# Patient Record
Sex: Male | Born: 1960 | Race: White | Hispanic: No | Marital: Single | Smoking: Never smoker
Health system: Southern US, Community
[De-identification: ages and names within clinical notes are randomized; demographics above are authoritative.]

## PROBLEM LIST (undated history)

## (undated) DIAGNOSIS — F419 Anxiety disorder, unspecified: Secondary | ICD-10-CM

## (undated) DIAGNOSIS — E119 Type 2 diabetes mellitus without complications: Secondary | ICD-10-CM

---

## 1999-06-30 ENCOUNTER — Inpatient Hospital Stay (HOSPITAL_COMMUNITY): Admission: EM | Admit: 1999-06-30 | Discharge: 1999-07-03 | Payer: Self-pay | Admitting: Psychiatry

## 1999-07-21 ENCOUNTER — Emergency Department (HOSPITAL_COMMUNITY): Admission: EM | Admit: 1999-07-21 | Discharge: 1999-07-21 | Payer: Self-pay | Admitting: Emergency Medicine

## 1999-07-21 ENCOUNTER — Encounter: Payer: Self-pay | Admitting: Emergency Medicine

## 1999-07-28 ENCOUNTER — Inpatient Hospital Stay (HOSPITAL_COMMUNITY): Admission: EM | Admit: 1999-07-28 | Discharge: 1999-08-01 | Payer: Self-pay | Admitting: Psychiatry

## 1999-08-15 ENCOUNTER — Inpatient Hospital Stay (HOSPITAL_COMMUNITY): Admission: AD | Admit: 1999-08-15 | Discharge: 1999-08-21 | Payer: Self-pay | Admitting: Psychiatry

## 2000-05-04 ENCOUNTER — Inpatient Hospital Stay (HOSPITAL_COMMUNITY): Admission: EM | Admit: 2000-05-04 | Discharge: 2000-05-07 | Payer: Self-pay | Admitting: Emergency Medicine

## 2000-05-04 ENCOUNTER — Encounter: Payer: Self-pay | Admitting: Emergency Medicine

## 2001-07-14 ENCOUNTER — Inpatient Hospital Stay (HOSPITAL_COMMUNITY): Admission: EM | Admit: 2001-07-14 | Discharge: 2001-07-18 | Payer: Self-pay | Admitting: Emergency Medicine

## 2002-12-15 ENCOUNTER — Encounter: Payer: Self-pay | Admitting: *Deleted

## 2002-12-15 ENCOUNTER — Emergency Department (HOSPITAL_COMMUNITY): Admission: EM | Admit: 2002-12-15 | Discharge: 2002-12-15 | Payer: Self-pay | Admitting: Emergency Medicine

## 2003-04-09 ENCOUNTER — Emergency Department (HOSPITAL_COMMUNITY): Admission: EM | Admit: 2003-04-09 | Discharge: 2003-04-10 | Payer: Self-pay | Admitting: Emergency Medicine

## 2003-04-10 ENCOUNTER — Encounter: Payer: Self-pay | Admitting: Emergency Medicine

## 2003-04-12 ENCOUNTER — Emergency Department (HOSPITAL_COMMUNITY): Admission: EM | Admit: 2003-04-12 | Discharge: 2003-04-12 | Payer: Self-pay | Admitting: Emergency Medicine

## 2003-04-12 ENCOUNTER — Encounter: Payer: Self-pay | Admitting: Emergency Medicine

## 2003-04-25 ENCOUNTER — Emergency Department (HOSPITAL_COMMUNITY): Admission: EM | Admit: 2003-04-25 | Discharge: 2003-04-25 | Payer: Self-pay | Admitting: Emergency Medicine

## 2003-04-25 ENCOUNTER — Encounter: Payer: Self-pay | Admitting: Emergency Medicine

## 2003-05-08 ENCOUNTER — Ambulatory Visit (HOSPITAL_COMMUNITY): Admission: RE | Admit: 2003-05-08 | Discharge: 2003-05-08 | Payer: Self-pay | Admitting: Internal Medicine

## 2003-06-29 ENCOUNTER — Ambulatory Visit (HOSPITAL_COMMUNITY): Admission: RE | Admit: 2003-06-29 | Discharge: 2003-06-29 | Payer: Self-pay | Admitting: Urology

## 2003-06-29 ENCOUNTER — Encounter: Payer: Self-pay | Admitting: Urology

## 2003-07-24 ENCOUNTER — Emergency Department (HOSPITAL_COMMUNITY): Admission: EM | Admit: 2003-07-24 | Discharge: 2003-07-25 | Payer: Self-pay | Admitting: *Deleted

## 2003-07-30 ENCOUNTER — Encounter: Payer: Self-pay | Admitting: *Deleted

## 2003-07-30 ENCOUNTER — Emergency Department (HOSPITAL_COMMUNITY): Admission: EM | Admit: 2003-07-30 | Discharge: 2003-07-31 | Payer: Self-pay | Admitting: *Deleted

## 2003-08-17 ENCOUNTER — Encounter: Payer: Self-pay | Admitting: Emergency Medicine

## 2003-08-17 ENCOUNTER — Emergency Department (HOSPITAL_COMMUNITY): Admission: EM | Admit: 2003-08-17 | Discharge: 2003-08-17 | Payer: Self-pay | Admitting: Emergency Medicine

## 2003-08-19 ENCOUNTER — Emergency Department (HOSPITAL_COMMUNITY): Admission: EM | Admit: 2003-08-19 | Discharge: 2003-08-19 | Payer: Self-pay | Admitting: Emergency Medicine

## 2003-08-19 ENCOUNTER — Encounter: Payer: Self-pay | Admitting: Emergency Medicine

## 2003-08-20 ENCOUNTER — Encounter: Payer: Self-pay | Admitting: Internal Medicine

## 2003-08-20 ENCOUNTER — Ambulatory Visit (HOSPITAL_COMMUNITY): Admission: RE | Admit: 2003-08-20 | Discharge: 2003-08-20 | Payer: Self-pay | Admitting: Internal Medicine

## 2003-09-03 ENCOUNTER — Ambulatory Visit (HOSPITAL_COMMUNITY): Admission: RE | Admit: 2003-09-03 | Discharge: 2003-09-03 | Payer: Self-pay | Admitting: Internal Medicine

## 2003-09-05 ENCOUNTER — Encounter: Payer: Self-pay | Admitting: Internal Medicine

## 2003-09-05 ENCOUNTER — Ambulatory Visit (HOSPITAL_COMMUNITY): Admission: RE | Admit: 2003-09-05 | Discharge: 2003-09-05 | Payer: Self-pay | Admitting: Internal Medicine

## 2003-09-07 ENCOUNTER — Emergency Department (HOSPITAL_COMMUNITY): Admission: EM | Admit: 2003-09-07 | Discharge: 2003-09-08 | Payer: Self-pay | Admitting: *Deleted

## 2003-10-11 ENCOUNTER — Ambulatory Visit (HOSPITAL_COMMUNITY): Admission: RE | Admit: 2003-10-11 | Discharge: 2003-10-11 | Payer: Self-pay | Admitting: Family Medicine

## 2003-10-11 ENCOUNTER — Encounter: Payer: Self-pay | Admitting: Family Medicine

## 2003-10-21 ENCOUNTER — Emergency Department (HOSPITAL_COMMUNITY): Admission: EM | Admit: 2003-10-21 | Discharge: 2003-10-22 | Payer: Self-pay | Admitting: *Deleted

## 2003-12-03 ENCOUNTER — Emergency Department (HOSPITAL_COMMUNITY): Admission: EM | Admit: 2003-12-03 | Discharge: 2003-12-04 | Payer: Self-pay | Admitting: Internal Medicine

## 2003-12-04 ENCOUNTER — Ambulatory Visit (HOSPITAL_COMMUNITY): Admission: RE | Admit: 2003-12-04 | Discharge: 2003-12-04 | Payer: Self-pay | Admitting: Family Medicine

## 2003-12-04 ENCOUNTER — Emergency Department (HOSPITAL_COMMUNITY): Admission: EM | Admit: 2003-12-04 | Discharge: 2003-12-05 | Payer: Self-pay | Admitting: Internal Medicine

## 2003-12-05 ENCOUNTER — Inpatient Hospital Stay (HOSPITAL_COMMUNITY): Admission: EM | Admit: 2003-12-05 | Discharge: 2003-12-11 | Payer: Self-pay | Admitting: Psychiatry

## 2003-12-08 ENCOUNTER — Encounter: Payer: Self-pay | Admitting: Emergency Medicine

## 2003-12-13 ENCOUNTER — Inpatient Hospital Stay (HOSPITAL_COMMUNITY): Admission: AD | Admit: 2003-12-13 | Discharge: 2003-12-15 | Payer: Self-pay | Admitting: Family Medicine

## 2003-12-15 ENCOUNTER — Emergency Department (HOSPITAL_COMMUNITY): Admission: EM | Admit: 2003-12-15 | Discharge: 2003-12-16 | Payer: Self-pay | Admitting: Emergency Medicine

## 2003-12-16 ENCOUNTER — Emergency Department (HOSPITAL_COMMUNITY): Admission: EM | Admit: 2003-12-16 | Discharge: 2003-12-17 | Payer: Self-pay | Admitting: Emergency Medicine

## 2003-12-30 ENCOUNTER — Emergency Department (HOSPITAL_COMMUNITY): Admission: EM | Admit: 2003-12-30 | Discharge: 2003-12-31 | Payer: Self-pay | Admitting: Emergency Medicine

## 2004-03-20 ENCOUNTER — Inpatient Hospital Stay (HOSPITAL_COMMUNITY): Admission: AD | Admit: 2004-03-20 | Discharge: 2004-03-26 | Payer: Self-pay | Admitting: Family Medicine

## 2004-04-07 ENCOUNTER — Ambulatory Visit (HOSPITAL_COMMUNITY): Admission: RE | Admit: 2004-04-07 | Discharge: 2004-04-07 | Payer: Self-pay | Admitting: Family Medicine

## 2004-04-17 ENCOUNTER — Emergency Department (HOSPITAL_COMMUNITY): Admission: EM | Admit: 2004-04-17 | Discharge: 2004-04-17 | Payer: Self-pay | Admitting: Emergency Medicine

## 2004-04-23 ENCOUNTER — Emergency Department (HOSPITAL_COMMUNITY): Admission: EM | Admit: 2004-04-23 | Discharge: 2004-04-23 | Payer: Self-pay | Admitting: Emergency Medicine

## 2004-05-12 ENCOUNTER — Emergency Department (HOSPITAL_COMMUNITY): Admission: EM | Admit: 2004-05-12 | Discharge: 2004-05-12 | Payer: Self-pay | Admitting: Emergency Medicine

## 2004-06-02 ENCOUNTER — Emergency Department (HOSPITAL_COMMUNITY): Admission: EM | Admit: 2004-06-02 | Discharge: 2004-06-02 | Payer: Self-pay | Admitting: Emergency Medicine

## 2004-07-01 ENCOUNTER — Emergency Department (HOSPITAL_COMMUNITY): Admission: EM | Admit: 2004-07-01 | Discharge: 2004-07-01 | Payer: Self-pay | Admitting: Emergency Medicine

## 2004-07-07 ENCOUNTER — Emergency Department (HOSPITAL_COMMUNITY): Admission: EM | Admit: 2004-07-07 | Discharge: 2004-07-07 | Payer: Self-pay | Admitting: Emergency Medicine

## 2004-07-08 ENCOUNTER — Emergency Department (HOSPITAL_COMMUNITY): Admission: EM | Admit: 2004-07-08 | Discharge: 2004-07-08 | Payer: Self-pay | Admitting: Emergency Medicine

## 2004-07-22 ENCOUNTER — Emergency Department (HOSPITAL_COMMUNITY): Admission: EM | Admit: 2004-07-22 | Discharge: 2004-07-23 | Payer: Self-pay | Admitting: Emergency Medicine

## 2004-09-19 ENCOUNTER — Emergency Department (HOSPITAL_COMMUNITY): Admission: EM | Admit: 2004-09-19 | Discharge: 2004-09-20 | Payer: Self-pay | Admitting: Emergency Medicine

## 2004-09-20 ENCOUNTER — Emergency Department (HOSPITAL_COMMUNITY): Admission: EM | Admit: 2004-09-20 | Discharge: 2004-09-20 | Payer: Self-pay | Admitting: Emergency Medicine

## 2004-09-21 ENCOUNTER — Inpatient Hospital Stay (HOSPITAL_COMMUNITY): Admission: EM | Admit: 2004-09-21 | Discharge: 2004-09-23 | Payer: Self-pay | Admitting: Emergency Medicine

## 2004-09-25 ENCOUNTER — Emergency Department (HOSPITAL_COMMUNITY): Admission: EM | Admit: 2004-09-25 | Discharge: 2004-09-25 | Payer: Self-pay | Admitting: Emergency Medicine

## 2004-09-27 ENCOUNTER — Emergency Department (HOSPITAL_COMMUNITY): Admission: EM | Admit: 2004-09-27 | Discharge: 2004-09-27 | Payer: Self-pay | Admitting: Emergency Medicine

## 2004-10-09 ENCOUNTER — Emergency Department (HOSPITAL_COMMUNITY): Admission: EM | Admit: 2004-10-09 | Discharge: 2004-10-09 | Payer: Self-pay | Admitting: Emergency Medicine

## 2004-12-11 ENCOUNTER — Emergency Department (HOSPITAL_COMMUNITY): Admission: EM | Admit: 2004-12-11 | Discharge: 2004-12-11 | Payer: Self-pay | Admitting: Emergency Medicine

## 2004-12-22 ENCOUNTER — Emergency Department (HOSPITAL_COMMUNITY): Admission: EM | Admit: 2004-12-22 | Discharge: 2004-12-23 | Payer: Self-pay | Admitting: *Deleted

## 2005-01-20 ENCOUNTER — Emergency Department (HOSPITAL_COMMUNITY): Admission: EM | Admit: 2005-01-20 | Discharge: 2005-01-20 | Payer: Self-pay | Admitting: *Deleted

## 2005-04-07 ENCOUNTER — Emergency Department (HOSPITAL_COMMUNITY): Admission: EM | Admit: 2005-04-07 | Discharge: 2005-04-08 | Payer: Self-pay | Admitting: *Deleted

## 2005-05-30 ENCOUNTER — Emergency Department (HOSPITAL_COMMUNITY): Admission: EM | Admit: 2005-05-30 | Discharge: 2005-05-30 | Payer: Self-pay | Admitting: *Deleted

## 2005-06-03 ENCOUNTER — Emergency Department (HOSPITAL_COMMUNITY): Admission: EM | Admit: 2005-06-03 | Discharge: 2005-06-03 | Payer: Self-pay | Admitting: Emergency Medicine

## 2005-06-15 ENCOUNTER — Emergency Department (HOSPITAL_COMMUNITY): Admission: EM | Admit: 2005-06-15 | Discharge: 2005-06-16 | Payer: Self-pay | Admitting: *Deleted

## 2005-06-18 ENCOUNTER — Emergency Department (HOSPITAL_COMMUNITY): Admission: EM | Admit: 2005-06-18 | Discharge: 2005-06-19 | Payer: Self-pay | Admitting: Emergency Medicine

## 2005-06-19 ENCOUNTER — Emergency Department (HOSPITAL_COMMUNITY): Admission: EM | Admit: 2005-06-19 | Discharge: 2005-06-19 | Payer: Self-pay | Admitting: Emergency Medicine

## 2005-08-05 ENCOUNTER — Emergency Department (HOSPITAL_COMMUNITY): Admission: EM | Admit: 2005-08-05 | Discharge: 2005-08-06 | Payer: Self-pay | Admitting: *Deleted

## 2005-08-10 ENCOUNTER — Emergency Department (HOSPITAL_COMMUNITY): Admission: EM | Admit: 2005-08-10 | Discharge: 2005-08-11 | Payer: Self-pay | Admitting: Emergency Medicine

## 2005-08-11 ENCOUNTER — Emergency Department (HOSPITAL_COMMUNITY): Admission: EM | Admit: 2005-08-11 | Discharge: 2005-08-11 | Payer: Self-pay | Admitting: Emergency Medicine

## 2005-08-29 ENCOUNTER — Emergency Department (HOSPITAL_COMMUNITY): Admission: EM | Admit: 2005-08-29 | Discharge: 2005-08-30 | Payer: Self-pay | Admitting: *Deleted

## 2005-12-04 ENCOUNTER — Emergency Department (HOSPITAL_COMMUNITY): Admission: EM | Admit: 2005-12-04 | Discharge: 2005-12-04 | Payer: Self-pay | Admitting: Emergency Medicine

## 2006-01-06 ENCOUNTER — Emergency Department (HOSPITAL_COMMUNITY): Admission: EM | Admit: 2006-01-06 | Discharge: 2006-01-07 | Payer: Self-pay | Admitting: Emergency Medicine

## 2006-02-10 ENCOUNTER — Emergency Department (HOSPITAL_COMMUNITY): Admission: EM | Admit: 2006-02-10 | Discharge: 2006-02-10 | Payer: Self-pay | Admitting: Emergency Medicine

## 2006-03-03 ENCOUNTER — Ambulatory Visit: Payer: Self-pay | Admitting: Internal Medicine

## 2006-03-06 ENCOUNTER — Emergency Department (HOSPITAL_COMMUNITY): Admission: EM | Admit: 2006-03-06 | Discharge: 2006-03-06 | Payer: Self-pay | Admitting: Emergency Medicine

## 2006-03-11 ENCOUNTER — Emergency Department (HOSPITAL_COMMUNITY): Admission: EM | Admit: 2006-03-11 | Discharge: 2006-03-12 | Payer: Self-pay | Admitting: Emergency Medicine

## 2006-03-12 ENCOUNTER — Emergency Department (HOSPITAL_COMMUNITY): Admission: EM | Admit: 2006-03-12 | Discharge: 2006-03-12 | Payer: Self-pay | Admitting: Emergency Medicine

## 2006-03-19 ENCOUNTER — Emergency Department (HOSPITAL_COMMUNITY): Admission: EM | Admit: 2006-03-19 | Discharge: 2006-03-19 | Payer: Self-pay | Admitting: Emergency Medicine

## 2006-04-02 ENCOUNTER — Ambulatory Visit (HOSPITAL_COMMUNITY): Admission: RE | Admit: 2006-04-02 | Discharge: 2006-04-02 | Payer: Self-pay | Admitting: Internal Medicine

## 2006-04-16 ENCOUNTER — Emergency Department (HOSPITAL_COMMUNITY): Admission: EM | Admit: 2006-04-16 | Discharge: 2006-04-16 | Payer: Self-pay | Admitting: Emergency Medicine

## 2006-04-27 ENCOUNTER — Emergency Department (HOSPITAL_COMMUNITY): Admission: EM | Admit: 2006-04-27 | Discharge: 2006-04-27 | Payer: Self-pay | Admitting: Emergency Medicine

## 2006-05-07 ENCOUNTER — Emergency Department (HOSPITAL_COMMUNITY): Admission: EM | Admit: 2006-05-07 | Discharge: 2006-05-07 | Payer: Self-pay | Admitting: Emergency Medicine

## 2006-05-08 ENCOUNTER — Emergency Department (HOSPITAL_COMMUNITY): Admission: EM | Admit: 2006-05-08 | Discharge: 2006-05-08 | Payer: Self-pay | Admitting: Emergency Medicine

## 2006-05-13 ENCOUNTER — Emergency Department (HOSPITAL_COMMUNITY): Admission: EM | Admit: 2006-05-13 | Discharge: 2006-05-13 | Payer: Self-pay | Admitting: Emergency Medicine

## 2006-07-29 ENCOUNTER — Emergency Department (HOSPITAL_COMMUNITY): Admission: EM | Admit: 2006-07-29 | Discharge: 2006-07-30 | Payer: Self-pay | Admitting: Emergency Medicine

## 2006-07-30 ENCOUNTER — Inpatient Hospital Stay (HOSPITAL_COMMUNITY): Admission: EM | Admit: 2006-07-30 | Discharge: 2006-08-03 | Payer: Self-pay | Admitting: *Deleted

## 2006-07-30 ENCOUNTER — Ambulatory Visit: Payer: Self-pay | Admitting: *Deleted

## 2006-07-31 ENCOUNTER — Encounter: Payer: Self-pay | Admitting: Emergency Medicine

## 2006-08-04 ENCOUNTER — Emergency Department (HOSPITAL_COMMUNITY): Admission: EM | Admit: 2006-08-04 | Discharge: 2006-08-04 | Payer: Self-pay | Admitting: Emergency Medicine

## 2006-08-14 ENCOUNTER — Emergency Department (HOSPITAL_COMMUNITY): Admission: EM | Admit: 2006-08-14 | Discharge: 2006-08-14 | Payer: Self-pay | Admitting: Emergency Medicine

## 2006-08-15 ENCOUNTER — Encounter: Payer: Self-pay | Admitting: Emergency Medicine

## 2006-08-16 ENCOUNTER — Inpatient Hospital Stay (HOSPITAL_COMMUNITY): Admission: EM | Admit: 2006-08-16 | Discharge: 2006-08-20 | Payer: Self-pay | Admitting: Psychiatry

## 2006-08-21 ENCOUNTER — Emergency Department (HOSPITAL_COMMUNITY): Admission: EM | Admit: 2006-08-21 | Discharge: 2006-08-21 | Payer: Self-pay | Admitting: Emergency Medicine

## 2006-09-10 ENCOUNTER — Emergency Department (HOSPITAL_COMMUNITY): Admission: EM | Admit: 2006-09-10 | Discharge: 2006-09-10 | Payer: Self-pay | Admitting: Emergency Medicine

## 2006-09-15 ENCOUNTER — Emergency Department (HOSPITAL_COMMUNITY): Admission: EM | Admit: 2006-09-15 | Discharge: 2006-09-15 | Payer: Self-pay | Admitting: Emergency Medicine

## 2006-09-19 ENCOUNTER — Inpatient Hospital Stay (HOSPITAL_COMMUNITY): Admission: EM | Admit: 2006-09-19 | Discharge: 2006-09-20 | Payer: Self-pay | Admitting: Emergency Medicine

## 2007-01-01 ENCOUNTER — Emergency Department (HOSPITAL_COMMUNITY): Admission: EM | Admit: 2007-01-01 | Discharge: 2007-01-01 | Payer: Self-pay | Admitting: Emergency Medicine

## 2007-03-20 ENCOUNTER — Emergency Department (HOSPITAL_COMMUNITY): Admission: EM | Admit: 2007-03-20 | Discharge: 2007-03-20 | Payer: Self-pay | Admitting: Emergency Medicine

## 2007-03-29 ENCOUNTER — Ambulatory Visit: Payer: Self-pay | Admitting: Internal Medicine

## 2007-03-29 ENCOUNTER — Inpatient Hospital Stay (HOSPITAL_COMMUNITY): Admission: EM | Admit: 2007-03-29 | Discharge: 2007-04-02 | Payer: Self-pay | Admitting: Emergency Medicine

## 2007-04-02 ENCOUNTER — Emergency Department (HOSPITAL_COMMUNITY): Admission: EM | Admit: 2007-04-02 | Discharge: 2007-04-02 | Payer: Self-pay | Admitting: Emergency Medicine

## 2007-04-03 ENCOUNTER — Emergency Department (HOSPITAL_COMMUNITY): Admission: EM | Admit: 2007-04-03 | Discharge: 2007-04-03 | Payer: Self-pay | Admitting: Emergency Medicine

## 2007-06-03 ENCOUNTER — Emergency Department (HOSPITAL_COMMUNITY): Admission: EM | Admit: 2007-06-03 | Discharge: 2007-06-04 | Payer: Self-pay | Admitting: Emergency Medicine

## 2007-06-12 ENCOUNTER — Emergency Department (HOSPITAL_COMMUNITY): Admission: EM | Admit: 2007-06-12 | Discharge: 2007-06-12 | Payer: Self-pay | Admitting: Emergency Medicine

## 2007-06-18 ENCOUNTER — Emergency Department (HOSPITAL_COMMUNITY): Admission: EM | Admit: 2007-06-18 | Discharge: 2007-06-18 | Payer: Self-pay | Admitting: Emergency Medicine

## 2007-07-31 ENCOUNTER — Emergency Department (HOSPITAL_COMMUNITY): Admission: EM | Admit: 2007-07-31 | Discharge: 2007-07-31 | Payer: Self-pay | Admitting: Emergency Medicine

## 2007-08-15 ENCOUNTER — Emergency Department (HOSPITAL_COMMUNITY): Admission: EM | Admit: 2007-08-15 | Discharge: 2007-08-15 | Payer: Self-pay | Admitting: Emergency Medicine

## 2007-08-16 ENCOUNTER — Emergency Department (HOSPITAL_COMMUNITY): Admission: EM | Admit: 2007-08-16 | Discharge: 2007-08-16 | Payer: Self-pay | Admitting: Emergency Medicine

## 2007-08-17 ENCOUNTER — Emergency Department (HOSPITAL_COMMUNITY): Admission: EM | Admit: 2007-08-17 | Discharge: 2007-08-17 | Payer: Self-pay | Admitting: Emergency Medicine

## 2007-08-20 ENCOUNTER — Emergency Department (HOSPITAL_COMMUNITY): Admission: EM | Admit: 2007-08-20 | Discharge: 2007-08-20 | Payer: Self-pay | Admitting: Family Medicine

## 2007-12-04 ENCOUNTER — Ambulatory Visit: Payer: Self-pay | Admitting: Cardiology

## 2007-12-21 ENCOUNTER — Emergency Department (HOSPITAL_COMMUNITY): Admission: EM | Admit: 2007-12-21 | Discharge: 2007-12-21 | Payer: Self-pay | Admitting: Emergency Medicine

## 2008-02-04 ENCOUNTER — Emergency Department (HOSPITAL_COMMUNITY): Admission: EM | Admit: 2008-02-04 | Discharge: 2008-02-04 | Payer: Self-pay | Admitting: Emergency Medicine

## 2008-03-04 ENCOUNTER — Emergency Department (HOSPITAL_COMMUNITY): Admission: EM | Admit: 2008-03-04 | Discharge: 2008-03-04 | Payer: Self-pay | Admitting: Emergency Medicine
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
## 2008-09-29 ENCOUNTER — Emergency Department (HOSPITAL_COMMUNITY): Admission: EM | Admit: 2008-09-29 | Discharge: 2008-09-29 | Payer: Self-pay | Admitting: Emergency Medicine

## 2011-01-09 ENCOUNTER — Ambulatory Visit (HOSPITAL_COMMUNITY)
Admission: RE | Admit: 2011-01-09 | Discharge: 2011-01-09 | Payer: Self-pay | Source: Home / Self Care | Attending: Internal Medicine | Admitting: Internal Medicine

## 2011-05-08 NOTE — Group Therapy Note (Signed)
NAME:  Jeremy Welch, Jeremy Welch                         ACCOUNT NO.:  [id number]   MEDICAL RECORD NO.:  [id number]                  PATIENT TYPE:   LOCATION:                                       FACILITY:   PHYSICIAN:  Angus G. Renard Matter, M.D.              DATE OF BIRTH:   DATE OF PROCEDURE:  03/25/2004  DATE OF DISCHARGE:                                   PROGRESS NOTE   This patient was admitted to the hospital with obstipation. He had a slight  elevation in troponin  He was seen by Cardiology and had Cardiolite study  scheduled this a.m.   OBJECTIVE:  VITAL SIGNS:  Blood pressure 112/75, respirations 20, pulse 83,  temperature 97.9.  LUNGS:  Clear to P&A. HEART:  Irregular rhythm. ABDOMEN:  No palpable organs or masses; slightly distended.   ASSESSMENT:  1. Patient was admitted with obstipation.  2. Does have schizophrenia.  3. Type 2 diabetes, well controlled.  4. Slight elevation of troponin which is being evaluated.  5. Rule out ischemic heart disease.      ___________________________________________                                            Ishmael Holter. Renard Matter, M.D.   AGM/MEDQ  D:  03/25/2004  T:  03/25/2004  Job:  [id number]

## 2011-05-08 NOTE — H&P (Signed)
NAME:  Jeremy Welch, Jeremy Welch                       ACCOUNT NO.:  [id number]   MEDICAL RECORD NO.:  [id number]                   PATIENT TYPE:  INP   LOCATION:  A304                                 FACILITY:  [facility]   PHYSICIAN:  Angus G. Renard Matter, M.D.              DATE OF BIRTH:  January 19, 1961   DATE OF ADMISSION:  12/13/2003  DATE OF DISCHARGE:                                HISTORY & PHYSICAL   CHIEF COMPLAINT:  This 50 year old white male was seen in the office prior  to admission with the chief complaint being severe midabdominal pain which  had been present for 6-8 hours prior to admission.   The patient had been gagging, but not vomiting intermittently.  His oral  intake had been minimal during the day.  This patient has problems with  mental retardation and emotional problems and has been under the care of  psychiatrists:  Dr. Kathrynn Running and Dr. Betti Cruz.  He is on multiple psychiatric  medications.  He has had prior evaluation for obstipation although he says  that his bowels moved yesterday.  The patient was given Mepergan 2 cc in the  office and subsequently was admitted.   FAMILY HISTORY:  See previous record.   SOCIAL HISTORY:  The patient does not smoke or use alcohol, drugs, etc.   Dictation ended at this point.     ___________________________________________                                         Ishmael Holter. Renard Matter, M.D.   AGM/MEDQ  D:  12/13/2003  T:  12/13/2003  Job:  [id number]

## 2011-05-08 NOTE — Op Note (Signed)
NAME:  Jeremy Welch, Jeremy Welch                       ACCOUNT NO.:  [id number]   MEDICAL RECORD NO.:  [id number]                   PATIENT TYPE:  AMB   LOCATION:  DAY                                  FACILITY:  [facility]   PHYSICIAN:  R. Roetta Sessions, M.D.              DATE OF BIRTH:  1961/11/07   DATE OF PROCEDURE:  05/08/2003  DATE OF DISCHARGE:                                 OPERATIVE REPORT   PROCEDURE:  Diagnostic esophagogastroduodenoscopy.   INDICATIONS FOR PROCEDURE:  The patient is a 50 year old gentleman with  abdominal pain in a setting of chronic constipation. Abdominal CT  demonstrated filling defects in the stomach which were concerning. EGD is  now being done to further evaluate the abnormalities seen on CT scan.   It is notable the patient's constipation has improved significantly on  MiraLax and along with improvement in constipation, his abdominal pain has  improved. He takes Nexium 40 mg orally for gastroesophageal reflux disease.  He feels his reflux symptoms are well controlled.   Please see my dictated consultation note.   MONITORING:  O2 saturation, blood pressure, pulse, and respirations were  monitored throughout the entire procedure.   CONSCIOUS SEDATION:  Versed 6 mg IV, Demerol 150 mg IV in divided doses.  Cetacaine spray for topical oropharyngeal anesthesia.   INSTRUMENTS:  Olympus video chip adult gastroscope.   FINDINGS:  Examination of the tubular esophagus revealed a couple of 2 mm  esophageal erosions at the EG junction, otherwise, the esophageal mucosa  appeared normal.  The EG junction was easily traversed.   STOMACH:  The gastric cavity was empty aside from bile stained mucous. This  was easily washed off the mucosa. The gastric cavity insufflated very well  with air.  A thorough examination of the gastric mucosa including a  retroflexed view of the proximal stomach and esophagogastric junction  demonstrated only a small hiatal hernia.  The  pylorus was patent and easily  traversed.   DUODENUM:  The bulb and second portion appeared normal.   THERAPEUTIC/DIAGNOSTIC MANEUVERS:  None.   The patient tolerated the procedure well and was reacted in endoscopy.   IMPRESSION:  1. A couple of tiny distal esophageal erosions consistent with mild reflux     esophagitis, otherwise, normal tubular esophagus.  2. Small hiatal hernia, otherwise, normal stomach, normal D1 and D2. Today's     findings are reassuring.  I suspect the recent CT findings are more on     the basis of artifact or possibility of some retained food in his     stomach. At any rate, by all accounts his abdominal pain has gotten     better with improvement in his constipation.   RECOMMENDATIONS:  1. Continue MiraLax on a p.r.n. basis.  2. Continue Nexium 40 mg orally daily.  3. Will see him back in the office in six weeks to see how he is doing prior  to concluding the consultation.                                               Jonathon Bellows, M.D.    RMR/MEDQ  D:  05/08/2003  T:  05/08/2003  Job:  [id number]   cc:   Angus G. Renard Matter, M.D.  [address]  [place]  [place] [address]  Fax: [phone]

## 2011-05-08 NOTE — Consult Note (Signed)
[place]  Patient:    Jeremy Welch, Jeremy Welch                    MRN: [id number] Proc. Date: 07/15/01 Adm. Date:  [id number] Attending:  Farley Ly                          Consultation Report  DATE OF BIRTH:  03-27-1961.  REQUESTING PHYSICIAN:  Dr. Madaline Guthrie.  REASON FOR CONSULTATION:  Questionable seizures.  HISTORY OF PRESENT ILLNESS:  This is the initial inpatient consultation evaluation of this 50 year old man, a resident of a group home, with a medical history including mental retardation and multiple psychiatric commorbidities who is brought to the emergency room today with "shaking spells." by his care giver at the group home.  These were described as brief episodes lasting 1 to 2 minutes in extension of all the extremities without apparent loss or alteration of consciousness.  These reportedly happened 6 or 7 times today. Per report he has had increasing muscle stiffness as well as worsening behavior control over the past 3 to 4 weeks.  He saw a psychiatrist at [facility] yesterday, at which time his dose of Gabitril was increased.  There is no known previous history of seizures.  He is brought to the emergency room and treated with Cogentin, Benadryl, and Ativan and this did seem to help.  He has had no further episodes since arrival to the hospital.  PAST MEDICAL HISTORY:  Mild mental retardation, impulse control disorder, ______ defective disorder, bipolar disorder, hypertension, diabetes, hyperlipidemia.  FAMILY HISTORY:  Noncontributory.  SOCIAL HISTORY:  He is a resident in a group home.  He works at [employer] as a Merchandiser, retail.  He requires assistance in his activities of daily living.  ALLERGIES:  No known drug allergies.Marland Kitchen  MEDICATIONS: 1. Lopid. 2. Glucotrol. 3. Zocor. 4. Senokot. 5. Gabitril 4 mg t.i.d. increased from 2 mg t.i.d. yesterday. 6. Luvox 100 mg b.i.d.  REVIEW OF SYSTEMS:   Unavailable secondary to patients mental state, also per history and physical.  PHYSICAL EXAMINATION:  VITAL SIGNS:  Temperature 98.6, blood pressure 120/80, pulse 104, respectively 18.  GENERAL:  He appears awake and alert, and in no evident distress.  NEUROLOGIC:  Cranial nerves:  Funduscopic exam is limited but not grossly abnormal.  Pupils are equal and briskly reactive.  He seems to look in all directions well.  He blinks to threat from both sides.  Face, tongue and palate move in the midline.  Motor exam:  Tone exam is difficult but there does appear to be increased tone in all extremities with at least antigravity strength.  Sensation:  He withdraws to pain in all extremities.  Reflexes:  2+ and symmetric.  Toes are downgoing.  LABORATORY DATA:  BMET is remarkable for glucose of 138.  CK is 117.  CBC is unremarkable.  CT of the head is personally reviewed and is normal.  IMPRESSION:  Shaking spells.  I agree that these are probably extrapyramidal side effects.  RECOMMENDATIONS: 1. I agree with holding Luvox per psychiatry. 2. May treat with Cogentin or Benadryl q.8-12 hours p.r.n. if necessary.  Thank you for this consultation. DD:  07/15/01 TD:  07/15/01 Job: [id number]

## 2011-05-08 NOTE — Op Note (Signed)
NAME:  LYDON, VANSICKLE                       ACCOUNT NO.:  [id number]   MEDICAL RECORD NO.:  [id number]                   PATIENT TYPE:  AMB   LOCATION:  DAY                                  FACILITY:  [facility]   PHYSICIAN:  R. Roetta Sessions, M.D.              DATE OF BIRTH:  24-Jun-1961   DATE OF PROCEDURE:  09/03/2003  DATE OF DISCHARGE:                                 OPERATIVE REPORT   PROCEDURE:  Diagnostic esophagogastroduodenoscopy followed by screening  (incomplete) colonoscopy.   INDICATIONS FOR PROCEDURE:  The patient is a 50 year old mentally challenged  gentleman with esophageal dysphagia.  He has a positive family history in a  first-degree relative at a young age.  He downplays typical reflux symptoms.  Recent barium pill esophagram demonstrated no abnormalities.  EGD is now  being done to further evaluate his dysphagia.  Colonoscopy is now being done  for high-risk screening.  This approach has been discussed with the patient  previously.  The potential risks, benefits, and alternatives have been  reviewed.  Please see my 08/28/2003 consultation dictation for more  information.   PROCEDURE:  O2 saturation, blood pressure, pulses, and respirations were  monitored throughout the entire procedure.  Conscious sedation was with  Versed 5 mg IV, Demerol 100 mg IV in divided doses for both procedures.  The  instrument used was the Olympus video chip adult gastroscope and pediatric  colonoscope.   EGD FINDINGS:  Examination of the tubular esophagus revealed multiple distal  esophageal erosions.  The patient was noted to have multiple linear columns  of distal esophageal erosions as long as 3 to 4 cm in length extending up  into the mid-tubular esophagus.  Please see photos.  There was no ring,  stricture, neoplastic process, and no evidence of Barrett's esophagus.  The  EG junction was easily traversed.   Stomach:  The gastric cavity was empty and insufflated well with air.  Thorough examination of the gastric mucosa including retroflex view in the  proximal stomach and esophagogastric junction demonstrated no abnormalities.  The pylorus was patent and easily traversed.   Duodenum:  The bulb and second portion appeared normal.   THERAPEUTIC/DIAGNOSTIC MANEUVERS PERFORMED:  None.   The patient tolerated the procedure well and was prepared for colonoscopy.   COLONOSCOPY FINDINGS:  Digital rectal examination revealed some liquid stool  in the rectal vault.   ENDOSCOPIC FINDINGS:  The prep was unfortunately poor.   Rectum:  Examination of the rectal mucosa including retroflex view of the  anal verge revealed no gross abnormalities.   Colon:  The colonic mucosa was surveyed from the rectosigmoid junction well  into what I believe to be the mid-colon.  The colon was somewhat dilated and  redundant.  There was viscous liquid stool throughout the colon which really  diminished the light and made the exam more difficult.  I washed and  suctioned along the way  to approximately the mid-transverse colon, but then  it became evident that the prep was too poor to complete the examination.  There were no gross mucosal abnormalities to the level it reached; however,  I consider the exam inadequate because of the poor prep (the patient stated  that he did not take all of his prep as instructed).  The scope was slowly  pulled out, and there were no gross mucosal abnormalities; however, all of  the mucosa was not seen.   The patient tolerated both procedures well and was reactive in endoscopy.   EGD IMPRESSION:  1. Extensive ulcerations of the distal one-half of the tubular esophagus     consistent with moderately severe erosive reflux esophagitis.  Otherwise,     normal tubular esophagus.  2. Normal stomach, normal first and second portions of the duodenum.   COLONOSCOPY IMPRESSION:  Incomplete colonoscopy due to a poor prep.  Grossly  normal rectum and colonic  mucosa in the area of the mid-transverse colon;  however, again, the exam was inadequate.   RECOMMENDATIONS:  1. Gastroesophageal reflux disease literature provided to Mr. Dolce.  2. Begin Nexium 40 mg orally daily before breakfast.  3. Air contrast barium enema in the near future to complement today's     colonoscopy.  4. Further recommendations to follow.                                               Jonathon Bellows, M.D.    RMR/MEDQ  D:  09/03/2003  T:  09/03/2003  Job:  [id number]   cc:   Angus G. Renard Matter, M.D.  [address]  [place]  [place] [address]  Fax: [phone]

## 2011-05-08 NOTE — Discharge Summary (Signed)
[place]  Patient:    Jeremy Welch, Jeremy Welch                    MRN: [id number] Adm. Date:  [id number] Disc. Date: [id number] Attending:  Farley Ly Dictator:   Jennette Kettle, M.D. CC:         Adelene Amas. Williford, M.D., psychiatry  Kelli Hope, M.D., neurology  Daine Floras, M.D., [facility]  Butch Penny, M.D., primary care physician   Discharge Summary  DISCHARGE DIAGNOSES: 1. 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 2. Diabetes mellitus type 2. 3. Mild mental retardation. 4. Impulsive control disorder. 5. Schizoaffective disorder. 6. Bipolar disorder. 7. History of obesity - currently lost greater than 100 pounds over the last    several years.  DISCHARGE MEDICATIONS: 1. Luvox 50 mg one tablet p.o. b.i.d. (This was a decrease from admission    100 mg b.i.d.). 2. Glucotrol XL 5 mg p.o. q.d. 3. Senokot two tablets q.h.s. 4. Gabitril 4 mg p.o. t.i.d.  FOLLOW-UP: 1. The patient has a follow-up appointment at [facility] with    Ms. Lalla Brothers on Monday, July 25, 2001, at 11:30 a.m.  The patient    is to see Ms. Arrington prior to seeing his regular psychiatrist, Daine Floras, M.D. 2. The patient is to call for a follow-up appointment with Butch Penny,    M.D., at [phone], regarding primary care physician appointment within the    next two weeks.  The patients mother was advised of this and states that    she will schedule an appointment with Dr. Renard Matter.  PROCEDURES AND DIAGNOSTIC STUDIES: 1. Head CT without contrast performed on July 14, 2001.  Impression:  No    evidence of acute intracranial abnormalities. 2. Electroencephalogram (EEG) performed on July 15, 2001.  Impression:  Normal    EEG, low voltages likely secondary to medication effect.  CONSULTANTS: 1. Adelene Amas. Williford, M.D., psychiatry, was  consulted regarding the patients    current Luvox treatment and extrapyramidal side effects. 2. Kelli Hope, M.D., was consulted regarding possible seizure-like    activity.  ADMISSION HISTORY AND PHYSICAL:  The patient presented on July 14, 2001, with his primary caregiver whose name is Engineer, manufacturing systems.  The patient is a 50 year old white male who presented to the emergency room after being brought by EMS for questionable "seizure-like" activity per caregiver.  The patient currently is living at [facility].  Per the caregiver, the patient started having increased "stiffness and shaking all over" at approximately noon on July 14, 2001.  The shaking lasted one to two minutes and occurred about six to seven times prior to admission.  The patient did fall to the floor during these shaking events but never lost consciousness.  The patient did not lose any bowel or urinary incontinence.  The patient previously had no fevers, chills, nausea or vomiting prior to these incidents. The patient was seen by Dr. Betti Cruz, who is his primary psychiatrist, the day prior to admission.  At this time the patients Gabitril was increased from 2 mg t.i.d. to 4 mg t.i.d. There was no change in the patients Luvox medication which was at 100 mg b.i.d.  The patient has no history of seizure disorders.  ADMISSION LABS:  The patients CBC revealed a white count of 9.1, hemoglobin 15.7, platelets 236 with no left shift. The patients electrolyte  panel revealed a sodium 138, potassium 4.2, chloride 104, bicarb 22, glucose 138, BUN 19, creatinine 1.2.  The patients liver function tests were within normal limits.  The patients initial creatinine kinase was within normal limits at 117.  The patients salicylate acid level was less than 4.0.  The patients urine drug screen was noted to be positive for benzodiazepines (the patient was given Ativan in the emergency room upon admission).  The patients serum myoglobin  level was within normal limits at 69.  The patients urinalysis was negative for nitrites, leukocytes, blood, protein, bilirubin, and glucose. Initial head CT in the emergency room was as stated above.  HOSPITAL COURSE:  #1 - SEIZURE-LIKE ACTIVITY INVOLVING THE UPPER AND LOWER EXTREMITIES BILATERALLY:  The patient initially presented to the emergency room with shaking in the upper and lower extremities bilaterally.  The patient was also very fidgety.  The patient also presented with increased eye twitching and head movement.  The patient was initially given Cogentin 1 mg, Ativan 2 mg and Benadryl 50 mg upon admission to the emergency room.  By the time internal medicine was consulted, the patient was more stable and there was no stiffness nor shaking apparent.  The patient was, however, very fidgety and anxious appearing.  The patient was admitted to the hospital for further work-up regarding this change in behavior.  A review of the patients medication revealed that he was on Luvox 100 mg b.i.d.  The likely etiology for the above shaking and rigidness stems from extrapyramidal side effects secondary to the patients Luvox medication.  An EEG was performed which showed no evidence of seizure-like activity.  The patient also had a negative creatinine kinase and myoglobin levels.  Dr. Jeanie Sewer was consulted with psychiatry as well as Dr. Thad Ranger with neurology.  It was their recommendations that the patients current symptoms were likely secondary to extrapyramidal side effects secondary to the Luvox medication.  While this medication had not been recently increased, it was Dr. Tommy Medal understanding that the patients current serotonin levels could have been slightly elevated secondary to the patients Luvox as well as the patient was taking Zocor.  The combination of both these medications can  potentiate serotonergic like syndrome.  The patients Zocor was discontinued upon admission  and the patients Luvox was initially decreased to have of his normal admission dose. The patient had no further complications throughout his hospital stay. The patient did not require any more Cogentin or Ativan throughout his five-day hospital stay.  The patient was discharged home with his mother with no extrapyramidal side effects noted.  According to the mother, the patient is going to be going back to a previous private home with a lady named Personal assistant, telephone number [phone].  Arrangements for the patient have been made through the mother.  #2 - HYPERCHOLESTEROLEMIA:  Dr. Renard Matter, the patients primary care physician, was contacted upon admission to the hospital and the patients Zocor and Lopid medication had recently been started in January of 2002.  At that time, the patients LDL cholesterol levels were mildly elevated at 111.  The risk of Zocor potentiating the Luvox medication was discussed with Dr. Renard Matter and it was under his recommendation as well as with Dr. Jeanie Sewer, here in psychiatry, that we go ahead and discontinue the Zocor medication.  The patient is to call Dr. Renard Matter upon discharge from the hospital at [phone], for a follow-up in the next two weeks.  The patients Lopid medication was also discontinued upon discharge to  the hospital.  #3 - DIABETES MELLITUS:  The patients glucose levels remained within normal limits on Glucotrol 5 mg p.o. q.d.  The patient has recently lost a significant amount of weight over the last few years secondary to good diet control.  The patients underlying diabetes mellitus may be reversing itself with this new weight loss.  Further investigation into using Glucotrol for glucose control should be done on an outpatient basis.  DISCHARGE LABS:  The patient had an electrolyte panel upon discharge that revealed sodium 142, potassium 3.7, chloride 106, bicarb 29, glucose 98, BUN 16, creatinine 1.0.  The patients CBC revealed a white count of  7.8, hemoglobin 14.5 with a platelet count of 222. DD:  07/18/01 TD:  07/19/01 Job: [id number]

## 2011-05-08 NOTE — Discharge Summary (Signed)
NAME:  Jeremy Welch, Jeremy Welch                       ACCOUNT NO.:  [id number]   MEDICAL RECORD NO.:  [id number]                   PATIENT TYPE:  INP   LOCATION:  2018                                 FACILITY:  [facility]   PHYSICIAN:  [place] Bing, M.D.               DATE OF BIRTH:  September 02, 1961   DATE OF ADMISSION:  03/25/2004  DATE OF DISCHARGE:  03/26/2004                           DISCHARGE SUMMARY - REFERRING   HISTORY OF PRESENT ILLNESS:  Jeremy Welch is a 50 year old male who  presented to [facility] on [facility], 2004 complaining of right sided  abdominal discomfort and anterior chest discomfort.  GI evaluated the  patient and was treated for constipation and he was started on a proton pump  inhibitor twice a day as well.  He describes the abdominal discomfort has  resolved, however, he reports a constant chest discomfort since admission.  He describes this as a pressure in the mid and left anterior chest  associated with shortness of breath.  RN notes report complaining of chest  discomfort with diaphoresis, relieved after nitroglycerin, Levsin and  morphine.  It is noted that the patient is a difficult historian and states  that he continues to have chest discomfort when we were consulted on March 24, 2004.   PAST MEDICAL HISTORY:  Notable for a bee sting allergy, mild mental  retardation, schizophrenia, obstipation, chronic constipation, diabetes,  hyperlipidemia, status post cholecystectomy, right hernia repair.  Fractured  right clavicle.  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   LABORATORY DATA:  Admission labs at [facility] showed an H & H of 15.8 and  45.3.  Normal indices.  Platelets 256, wbc's 6.9.  Sodium 135, potassium  4.2, BUN 10, creatinine 0.8, normal LFTs, glucose 153.  CK, total MB and  troponins were negative for a myocardial infarction.  Fasting lipids showed  a total cholesterol of 175, triglycerides 292, HDL 33, LDL 84.   Urinalysis  was unremarkable.  Subsequent enzymes at [facility] were also  within normal limits.  Chemistry on the 6th at [facility] showed a potassium  of 4, sodium 139, BUN 12, creatinine 1.  PT 12.4, PTT 78. H & H was stable  on the 6th at 15.1 and 43.1, normal indices.  Platelets are 242.  WBC is  5.6.   Chest x-ray on the 2nd shows hypoinflated lungs with bibasilar atelectasis.  CT of the abdomen and pelvis at [facility] showed moderate retained stool  within the colon, otherwise negative.  Cardiolite obtained on the 5th of  April, showed an EF of 45%, inferior septal dyskinesis, however, no  significant ischemia.  EKG at [facility] showed normal sinus rhythm, left  axis deviations, delayed R-waves.   HOSPITAL COURSE:  Jeremy Welch was transferred from [facility] to  [facility] on March 25, 2004 in anticipation of cardiac  catheterization to  evaluate his ongoing chest discomfort.  He was admitted  to the unit 2000 and continued on his transfer medications.  Catheterization  was performed on March 26, 2004 without difficulty by Dr. Gerri Spore.  This  did not show any coronary artery disease.  He had an ejection fraction  greater than 60% with trace MR.  Dr. Gerri Spore, after catheterization,  reviewed with the patient and the patient's parents.  Catheterization site  was intact, thus, it was felt that he could be discharged to home after  ambulation.   DISCHARGE DIAGNOSES:  1. Chest discomfort of uncertain etiology.  2. Constipation.  3. History of __________.   DISPOSITION:  He is instructed to resume his Glucophage 500 mg b.i.d. on  Friday.  He was asked to continue his Risperdal 1 mg q.h.s.; Vytorin 10/20  daily; Trileptal 300 mg daily; Cymbalta 60 mg daily and Gabitril 8 mg q.d.  Based on the note from the GI physician at [facility], Dr. Karilyn Cota,  he needs to continue his proton pump inhibitor b.i.d. for eight weeks then  40 mg q.d. and also  Metamucil teaspoon daily.  He was given instructions for  Tylenol as needed for discomfort.  He was advised no lifting, driving,  sexual activity or heavy exertion for two days.  Dr. Gerri Spore gave him  permission to return to work on Monday, April 11.  He was asked to maintain  a low salt, low fat, cholesterol, ADA diet.  If he had any problems with his  catheterization site, he was asked to call our office.  He will have a groin  check with Jae Dire, [facility] on Wednesday 04/02/04 at 3:00 p.m.  At the time  of that review, should be encouraged cardiac risk factor modifications.  He  will return for follow up in one to two weeks with appointment with Dr.  Renard Matter for overall medical care.      Joellyn Rued, P.A. [facility]                    [place] Bing, M.D.    EW/MEDQ  D:  03/26/2004  T:  03/27/2004  Job:  [id number]   cc:   Vida Roller, M.D.  Fax: [phone]

## 2011-05-08 NOTE — Discharge Summary (Signed)
NAME:  Jeremy Welch, DYAR NO.:  [id number]   MEDICAL RECORD NO.:  [id number]          PATIENT TYPE:  IPS   LOCATION:  0401                          FACILITY:  [facility]   PHYSICIAN:  Anselm Jungling, MD  DATE OF BIRTH:  01/23/1961   DATE OF ADMISSION:  07/30/2006  DATE OF DISCHARGE:  08/03/2006                                 DISCHARGE SUMMARY   IDENTIFYING DATA/REASON FOR ADMISSION:  The patient is a 50 year old single  white male who came to [place] at a time when his psychiatric history was unclear  to [place].  He had recently moved out of his parents home, due to their  advancing age and incapacity, to live in a group home.  He was depressed  over this, missing his parental home.  He wrapped a cord around his neck in  a suicide gesture.  This led to his hospitalization.  At the time of his  admission, it was not clear whether he had a history of schizophrenia or  developmental disability or both.  Please refer to the admission note for  further details pertaining to the symptoms, circumstances and history that  led to his hospitalization.   INITIAL DIAGNOSTIC IMPRESSION:  He was given an initial AXIS I diagnoses of  schizophrenia, chronic, undifferentiated-type, and rule out subaverage IQ.   Please note that the undersigned was the attending physician on the first  hospital day, but on July 31, 2006 and August 01, 2006, the patient was  seen by Dr. Lolly Mustache, who was covering on my weekend absence.  The patient was  discharged on August 03, 2006 after examination by the psychiatric nurse  practitioner, Kari Baars.   MEDICAL/LABORATORY:  The patient was medically and physically assessed by  the psychiatric nurse practitioner and followed closely throughout.  He came  to [place] with multiple medical problems including insulin-dependent diabetes  mellitus, hypertension, and gastroesophageal reflux disease.  He was  continued on Lantus insulin per protocol regimen and metformin  500 mg b.i.d.  For gastrointestinal conditions, he was given Aciphex 20 mg daily and  MiraLax 17 grams daily in juice.  He was continued on metoprolol 25 mg  b.i.d. and ranitidine 150 mg b.i.d.  He had x-ray studies done during his  stay which showed no sign of kidney stone or bowel problems.  He had some  abdominal discomfort during his stay which subsided after stopping Depakote,  which had been begun on a trial basis, early in his hospital stay.   HOSPITAL COURSE:  The patient was admitted the adult inpatient psychiatric  service.  He presented as a well-nourished, well-developed male who was  alert, and fully oriented, but on the surface appeared to be of possibly  limited IQ.  He stated that he was in the hospital, the psychiatric unit,  because of bladder problems and to have my meds changed.  He could not  describe any psychiatric, mood or thought-related symptoms to me all.  He  denied suicidal ideation.  He was calm, reserved, and polite.  He remained  calm during the first 24 hours but, on  the second hospital day, Dr. Lolly Mustache  noted that the patient was labile.  He also noted paranoid ideation about  the previous group homes' staff.  The patient was started on a regimen of  Abilify 10 mg at bedtime.  With regard to other psychotropics, he was given  Trileptal 900 mg b.i.d. and, for sleep, Ambien 10 mg q.h.s.  These were all  well-tolerated.   By the fifth hospital day, the patient reported that he was feeling much  better.  His various physical complaints had abated after stopping the trial  of Depakote that had been undertaken.  He was up and about in the milieu and  displayed normal appetite.  He indicated that he felt ready for discharge,  and appeared appropriate for same.   AFTERCARE:  The patient was to follow up with Levester Fresh at [facility] with an appointment on August 11, 2006.  He was urged  to see his urologist in [place] if he had any  further symptoms that could  be construed as having a urinary or renal basis.  He was instructed to also  follow up with his medical doctor.   DISCHARGE MEDICATIONS:  1. Trileptal 900 mg b.i.d.  2. Diovan 160 mg daily.  3. Ambien 10 mg q.h.s. p.r.n. insomnia.  4. Aciphex 20 mg daily.  5. Abilify 10 mg q.h.s.  6. Ranitidine 150 mg twice a day.  7. Metoprolol 25 mg b.i.d.  8. Colace 100 mg b.i.d.  9. Lantus insulin 85 units at bedtime.  10.Metformin 500 mg b.i.d.  11.MiraLax 17 grams daily in juice.   DISCHARGE DIAGNOSES:  AXIS I:  Schizophrenia, chronic, paranoid type, acute  exacerbation, resolving.  AXIS II:  Suspected subnormal IQ.  AXIS III:  History of gastroesophageal reflux disease, insulin-dependent  diabetes mellitus.  AXIS IV:  Stressors:  Severe.  AXIS V:  GAF on discharge 55.      Anselm Jungling, MD  Electronically Signed     SPB/MEDQ  D:  08/12/2006  T:  08/12/2006  Job:  [id number]

## 2011-05-08 NOTE — Consult Note (Signed)
NAME:  Jeremy Welch, Jeremy Welch                       ACCOUNT NO.:  [id number]   MEDICAL RECORD NO.:  [id number]                   PATIENT TYPE:  INP   LOCATION:  2018                                 FACILITY:  [facility]   PHYSICIAN:  Jeremy Welch, M.D.                DATE OF BIRTH:  Jan 06, 1961   DATE OF CONSULTATION:  03/25/2004  DATE OF DISCHARGE:                                   CONSULTATION   PRIMARY CARE PHYSICIAN:  Jeremy Welch, M.D.   HISTORY OF PRESENT ILLNESS:  Jeremy Welch is a 50 year old male who  presented to [facility] on March 20, 2004 with complaints of right-  sided abdominal pain and anterior chest discomfort.  Gastroenterology has  evaluated this patient and he has been treated for constipation and also  started on a proton pump inhibitor twice daily as well.  The patient reports  that his abdominal pain has resolved.  However, he continues to have  constant chest discomfort since admission.  He describes this as a pressure  in the substernal chest and left anterior chest.  He also reports associated  dyspnea.  He is a very difficult historian, however.  Nurses' notes report  complaints of chest discomfort with diaphoresis which were relieved after  giving sublingual nitroglycerin, sublingual Levsin and morphine IV.   PAST MEDICAL HISTORY:  1. History of obstipation and chronic constipation requiring previous     hospital admission.  2. Diabetes mellitus.  3. Dyslipidemia.  4. Mild mental retardation and schizophrenia.  5. Status post cholecystectomy.  6. Status post right inguinal hernia repair.  7. History of fracture right clavicle.  8. He also had an EGD done in September 2004 which revealed ulcerative     esophagitis.  9. He also had questionable history of a seizure disorder.  10.      No previous cardiac history.   REVIEW OF SYSTEMS:  Positive for occasional headaches, sore throat, cough,  dyspnea on exertion, mild peripheral edema, occasional  palpitations, and  myalgia in his legs.  He also reports episodes of nausea and vomiting with  constipation; otherwise negative except for per HPI.   SOCIAL HISTORY:  Jeremy Welch lives in [place] with his parents.  He  works at [employer].  He is single.  No children.  Denies  any tobacco, alcohol or illicit drug use.   FAMILY HISTORY:  Mother is alive and well.  Father is alive and has history  of colorectal cancer.  He has two brothers.  One he is unsure of how old he  is and has questionable history of an MI recently.   ALLERGIES:  He is allergic to bee stings.  No known drug allergies.   CURRENT MEDICATIONS PRIOR TO ADMISSION:  1. Gabitril 8 mg daily.  2. Cymbalta 60 mg daily.  3. Trileptal 300 mg daily.  4. __________ 10/20 daily.  5. Risperdal 1  mg at night.  6. Glucophage 500 mg b.i.d.   MEDICATIONS IN HOSPITAL:  1. Aspirin 81 mg daily.  2. Cymbalta 60 mg daily.  3. Zetia 10 mg daily.  4. Glucotrol XL 5 mg b.i.d.  5. Glucophage 500 mg b.i.d. which is being held.  6. Citrucel daily.  7. Trileptal 300 mg b.i.d.  8. Protonix 40 mg b.i.d.  9. MiraLax 17 g daily.  10.      Risperdal 1 gm at night.  11.      Zocor 20 mg daily.  12.      IV of 1/2-normal saline at 20 mL per hour.   PHYSICAL EXAMINATION:  VITAL SIGNS:  Temperature 96.8, pulse 95,  respirations 20, blood pressure 118/78.  Blood glucose 137.  Weight 198  pounds.  GENERAL:  Well-developed, well-nourished male in no acute distress.  HEENT:  Normocephalic, atraumatic.  Pupils are equal, round and reactive to  light.  Extraocular movements are intact.  NECK:  Supple without lymphadenopathy.  No carotid bruits or jugular venous  distension are noted.  CARDIOVASCULAR:  Slightly tachycardic.  Regular rhythm.  No murmurs, rubs or  gallops are appreciated.  LUNGS:  Clear to auscultation bilaterally without wheezes, rales or rhonchi.  SKIN:  Warm and dry with no rashes or lesions noted.   ABDOMEN:  Obese, soft.  Normoactive bowel sounds.  No rebound or guarding.  Very mild tenderness in the left upper quadrant.  No hepatosplenomegaly is  noted.  GU/RECTAL:  Deferred.  EXTREMITIES:  No cyanosis, clubbing or edema.  Distal pulses are intact  bilaterally.  MUSCULOSKELETAL:  No joint deformity or effusions are noted.  NEUROLOGIC:  Alert and oriented x 3.  Cranial nerves II-XII grossly intact.  Strength is equal in all extremities.   LABORATORY DATA:  Abdominopelvic CT scan revealed fatty liver, moderate  stool in the colon.  Chest x-ray revealed hyperinflated lungs with bibasilar  atelectasis.  EKG reveals sinus rhythm at 89 beats per minute with left axis  deviation, normal P-R interval, normal QRS duration, normal QTC.  Nonspecific ST abnormalities and LVH are noted.  He does have poor R wave  progression across the anterior leads.  White blood count 7.8, hemoglobin 15.7, hematocrit 45.2, platelets 236,000.  Sodium 132, potassium 4.5, chloride 97, CO2 28, BUN 11, creatinine 1,  glucose 263, total bilirubin 0.4, AST 29, ALT 30, alk phos 83, total protein  5.7, albumin 3.7.  Cardiac enzymes are negative x3 for acute myocardial  infarction.  Lipids reveal total cholesterol of 175, triglycerides 292, HDL  33, LDL 84.   IMPRESSION AND PLAN:  1. Chest pain, very atypical for coronary artery disease.  However, with the     patient being a poor historian and having multiple cardiac risk factors,     it is important to consider this as an etiology for his chest discomfort.     Cardiac enzymes were negative x3 for acute myocardial infarction upon     admission and repeat cardiac enzymes done yesterday after recurrent chest     discomfort are also negative for acute myocardial infarction.  EKG     reveals poor R wave progression.  However, there are no acute ischemic    changes noted.  His cardiac risk factors include male sex, family history     of diabetes and dyslipidemia.   Further evaluation will be pursued with     the stress Cardiolite tomorrow.  Will continue his other medications as  listed above.  2. Abdominal pain resolved.  3. Constipation resolved.  4. Dyslipidemia on __________ .  5. Diabetes mellitus per primary team.     ________________________________________  ___________________________________________  Jae Dire, P.A. [facility]                      Jeremy Welch, M.D.   AB/MEDQ  D:  03/24/2004  T:  03/25/2004  Job:  [id number]

## 2011-05-08 NOTE — Discharge Summary (Signed)
NAME:  Jeremy Welch, Jeremy Welch                       ACCOUNT NO.:  [id number]   MEDICAL RECORD NO.:  [id number]                   PATIENT TYPE:  INP   LOCATION:  2018                                 FACILITY:  [facility]   PHYSICIAN:  Angus G. Renard Matter, M.D.              DATE OF BIRTH:  Jan 12, 1961   DATE OF ADMISSION:  03/20/2004  DATE OF DISCHARGE:  03/25/2004                                 DISCHARGE SUMMARY   DIAGNOSES:  1. Chest pain.  2. Possible underlying ischemic heart disease.  3. Constipation.  4. Dyslipidemia.  5. Diabetes mellitus type II.  6. Chronic obstipation and constipation.  7. Mental retardation.  8. Schizophrenia.  9. Ulcerative esophagitis.   CONDITION ON DISCHARGE:  Stable and improved.   HISTORY OF PRESENT ILLNESS:  This patient was admitted with chief complaint  being upper abdominal pain, anterior chest pain.  He was seen in the office  prior to admission with chief complaint being anterior chest pain, upper  abdominal pain which has been present for the last 24 hours.  He apparently  came to the office because of increased pain and clammy feeling prior to his  coming to the office.  He had no vomiting.  He had been intermittently  constipated.  Has lazy bowel syndrome.  The patient had been in the hospital  previously for multiple enemas and treatment for obstipation.  Electrocardiogram done in the office and showed QS in lead 3, poor R wave  progression over the precordial leads.  The patient does have mild  retardation, schizophrenia, multiple medications for this.   PHYSICAL EXAMINATION:  GENERAL APPEARANCE:  Alert __________ white male with  blood pressure 120/80, pulse 60, respirations 18, temperature 98.  HEENT:  Eyes PERRLA.  TMs negative.  Oropharynx benign.  NECK:  Supple.  No JVD or thyroid abnormalities.  LUNGS:  Clear to P&A.  HEART:  Regular rate and rhythm.  No murmurs.  ABDOMEN:  Slightly distended with tenderness in the upper abdomen.  EXTREMITIES:  Free of edema.  NEUROLOGICAL:  No focal deficit.   LABORATORY DATA:  Admission CBC:  WBC 6900, hemoglobin 15.8, hematocrit  45.3.  Subsequent CBC on March 21, 2004:  WBC 7800, hemoglobin 15.7,  hematocrit 45.2.  Chemistry:  Sodium 135, potassium 4.2, chloride 99, CO2  28, glucose 153, BUN 10, creatinine 0.8, calcium 9.3, total protein 6,  albumin 3.9.  Liver enzymes:  SGOT 30, SGPT 33, alkaline phosphatase 83,  bilirubin 0.5.  CK on admission 104, CK MB 4.5, troponin less than 0.01.  Subsequent enzymes March 21, 2004, CK 74, CK MB 3, troponin 0.06.  Lipid  profile:  Cholesterol 175, triglycerides 292, LDL 84, HDL 33.  Urinalysis  negative.   ELECTROCARDIOGRAM:  Sinus bradycardia.  Nonspecific T wave abnormality.   X-RAY:  CT of the abdomen with IV contrast, moderate retained stool within  the colon.  Otherwise, negative CT of the pelvis.  Chest x-ray with  hypoinflated lungs, bibasilar atelectasis.   ELECTROCARDIOGRAM:  Normal sinus rhythm, poor R wave progression over  precordial leads.   HOSPITAL COURSE:  The patient, at time of admission, was continued on his  regular medications.  Gabitril 4 mg 2 daily, Cymbalta 60 mg daily, Trileptal  300 mg b.i.d., Vytorin 10/20 1 daily, Risperdal 1 mg q.h.s., Glucophage XR  500 mg b.i.d.  the patient was placed on Accu-Chek's q.a.c. and q.h.s., a  2000 calorie diabetic diet.  CT of the abdomen was done shortly after  admission which showed moderately retained stool within the colon.  Otherwise, negative CT of the pelvis.  Diffuse fatty changes within the  liver.  The patient was seen by the GI service Dr. Karilyn Cota who placed the  patient on Levsin sublingual 1 t.i.d., MiraLax 17 g q.h.s. daily.  The  patient was given morphine sulfate 2 mg IV q.4h. p.r.n. for pain.  The  patient also was seen by Cardiology Service.  Cardiolite study was done.  The patient was transferred to [facility] via [facility] on March 24, 2004, for  further assessment of chest pain.  It was felt the patient had  multiple cardiac risk factors.  EKG showed poor R wave progression.  No  other acute ischemic changes were noted.  The patient was stable at the time  of his transfer on hospital day #5.     ___________________________________________                                         Ishmael Holter. Renard Matter, M.D.   AGM/MEDQ  D:  04/15/2004  T:  04/15/2004  Job:  [id number]

## 2011-05-08 NOTE — H&P (Signed)
NAME:  Jeremy Welch, Jeremy Welch                       ACCOUNT NO.:  [id number]   MEDICAL RECORD NO.:  [id number]                   PATIENT TYPE:  INP   LOCATION:  A304                                 FACILITY:  [facility]   PHYSICIAN:  Angus G. Renard Matter, M.D.              DATE OF BIRTH:  [birth year]   DATE OF ADMISSION:  12/13/2003  DATE OF DISCHARGE:                                HISTORY & PHYSICAL   ALLERGIES:  No known allergies.   PAST MEDICAL AND SURGICAL HISTORY:  Previous history of cholecystectomy,  hernia repair, left shoulder surgery.  Medical History:  Hypertension,  mental retardation.  Non-insulin-dependent diabetes.  Behavioral problem  under the care of Drs. Kathrynn Running and [employer]   MEDICATION LIST:  1. Mobic 15 mg daily.  2. Protonix 40 mg daily.  3. Lotensin 10 mg daily.  4. Glucotrol 10 mg each morning and 5 mg each p.m.  5. Doxycycline 100 mg daily.  6. Simethicone 1 q.i.d.  7. Trileptal 300 mg h.s.  8. Risperdal 0.5 mg h.s.   REVIEW OF SYSTEMS:  HEENT:  Negative.  CARDIOPULMONARY:  No cough,  hemoptysis or dyspnea.  GI:  Episodes of retching, midabdominal pain.  No  diarrhea.  GU:  Slight dysuria.  No frequency, urgency, or hematuria.   PHYSICAL EXAMINATION:  GENERAL:  Extremely uncomfortable white male.  VITAL SIGNS:  Blood pressure 110/60, pulse 60, respirations 18, temperature  98.  HEENT:  Eyes PERRLA. TMs negative.  Oropharynx benign.  NECK:  Supple.  No JVD or thyroid abnormalities.  LUNGS:  Clear to P&A.  HEART:  Regular rhythm, no murmurs, no cardiomegaly.  ABDOMEN:  Distended.  Tenderness in mid-and-upper abdomen.  EXTREMITIES:  Free of edema.   DIAGNOSES:  1. Abdominal pain of undetermined etiology.  2. Non-insulin-dependent diabetes.  3. History of mental retardation.  4. Behavioral problems.    ___________________________________________                                         Ishmael Holter Renard Matter, M.D.   AGM/MEDQ  D:  12/13/2003  T:  12/13/2003  Job:   [id number]
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
## 2011-05-08 NOTE — Group Therapy Note (Signed)
NAME:  Jeremy Welch, Jeremy Welch                       ACCOUNT NO.:  [id number]   MEDICAL RECORD NO.:  [id number]                   PATIENT TYPE:  INP   LOCATION:  A214                                 FACILITY:  [facility]   PHYSICIAN:  Mila Homer. Sudie Bailey, M.D.           DATE OF BIRTH:  1961-02-03   DATE OF PROCEDURE:  03/23/2004  DATE OF DISCHARGE:                                   PROGRESS NOTE   SUBJECTIVE:  He is still complaining of abdominal pain and chest pain.  He  is difficult to evaluate since he will complain of pain, but be smiling  while he is complaining.  His Mom and Dad were there today.  Apparently, his  48 year old brother has coronary artery disease and just came home from  [facility].  Both of his parents have had coronary disease.  Both  his maternal grandparents have coronary artery disease.  His father's aunt  has had coronary disease.  The patient himself has had known diabetes x6  years.  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   OBJECTIVE:  VITAL SIGNS:  Temperature 97.8, pulse 97, respirations 16, blood  pressure 158/96.  GENERAL:  He appears to be alert, but it is hard to tell based on his  history whether he is really having pain.  He knows that his doctor is Dr.  Johnnye Lana.  He really did not want to go home.  HEART:  Regular rhythm without murmur.  LUNGS:  Clear throughout.  ABDOMEN:  Soft and obese without hepatosplenomegaly or mass.  EXTREMITIES:  No edema in the ankles.   Sugars have been 129, 134, 163 and 97.  Today, I did an EKG which really has  not changed.  There is no obvious ischemic change and his cardiac enzymes  were normal including a troponin of 0.1 (several days troponin was 0.06).   ASSESSMENT:  1. Chest pain.  2. Family history for coronary artery disease including a brother age 48.  3. Type 2 diabetes.  4. Obesity.  5. Mental retardation.   PLAN:  Given the above, the difficulty  of getting good history and the risk  factors, we will keep him in the hospital.  We will have cardiology see him  tomorrow.  Possibly a Cardiolite stress would help at this point.   Time spent on this patient today reviewing his electronic medical record,  paper medical record, talking to the patient in room, examining the patient,  coming back later and talking to the family extensively in the room given  the patient's difficulty with his history, talking to nurses, formulating  plan and dictating note was 1-1/2 hours.     ___________________________________________  Mila Homer. Sudie Bailey, M.D.   SDK/MEDQ  D:  03/23/2004  T:  03/24/2004  Job:  [id number]

## 2011-05-08 NOTE — Discharge Summary (Signed)
NAME:  Jeremy Welch, Jeremy Welch NO.:  [id number]   MEDICAL RECORD NO.:  [id number]          PATIENT TYPE:  INP   LOCATION:  5502                         FACILITY:  [facility]   PHYSICIAN:  Alvester Morin, M.D.  DATE OF BIRTH:  January 15, 1961   DATE OF ADMISSION:  03/29/2007  DATE OF DISCHARGE:  04/02/2007                               DISCHARGE SUMMARY   DISCHARGE DIAGNOSES:  1. Nonketotic hyperosmolar state due to uncontrolled diabetes.  2. Schizophrenia.  3. Hypertension.  4. Hypertriglyceridemia.  5. Prolonged QT interval.  6. Abdominal pain likely due to gastroesophageal reflux disease.  7. Mild mental retardation.   DISCHARGE MEDICATIONS:  1. Metformin 500 mg p.o. b.i.d.  2. Glipizide 5 mg p.o. once daily.  3. Metoprolol 25 mg p.o. b.i.d.  4. Simvastatin 40 mg p.o. once daily.  5. Lopid 600 mg p.o. b.i.d.  6. Senna 1 tablet p.o. q.h.s. p.r.n. for constipation.  7. Cozaar 25 mg daily.  8. Omeprazole 200 mg p.o. b.i.d.  9. Tegretol 300 mg p.o.TID.  10.Zolpidem 10 mg p.o. q.h.s. p.r.n.   DISPOSITION/DISCHARGE INSTRUCTIONS:  1. Jeremy Welch will follow up with Jeremy Welch on April25th at      10:30 a.m.  At that time, his B-MET needs to be checked.  He also      needs his blood sugar checked to see if his diabetes is under good      control .  2. He was recently started on Lopid 600 mg p.o. b.i.d. for      hypertriglyceridemia.  His fasting lipid panel needs to be checked.  3. He was also noted to have a prolonged QT interval which was thought      to be due to an electrolyte disturbance during this admission.  He      will need to have that followed up.  Of note, his QT interval did      improve with improvement in his metabolic derangements.  4. He will also need followup with the behavioral health physicians      for his schizophrenia.   HISTORY OF PRESENT ILLNESS:  Jeremy Welch is a 50 year old white male  with a past medical history significant for type 2  diabetes diagnosed 7  years ago, multiple admissions to the behavioral health center for  psychotic behavior and schizophrenia, history of hypertension and GERD.  He presented to the ED with a diagnosis of uncontrolled blood sugars.  His chief complaint at the time of presentation were polyuria and low  back pain.   ALLERGIES:  BEE STINGS.   PAST MEDICAL HISTORY:  Significant for:  1. Type 2 diabetes.  2. Schizophrenia.  3. Hypertension.  4. Hyperlipidemia.  5. GERD.  6. Mild mental retardation.   MEDICATIONS:  1. Carbamazepine 200 mg daily.  2. Metoprolol 25 mg daily.  3. Protonix 40 mg daily.  4. Zocor 20 mg daily.  5. Cozaar 25 mg daily.  6. Zolpidem 10 mg q.h.s.  7. Metformin 500 mg p.o. b.i.d.   SUBSTANCES:  He does not smoke, does not drink.   SOCIAL HISTORY:  Single, graduated high school, never worked, lives with  his mother.   FAMILY HISTORY:  Significant for diabetes and coronary artery disease in  both parents.   REVIEW OF SYSTEMS:  Significant for weight loss and vomiting.   PHYSICAL EXAMINATION:  VITAL SIGNS:  Temperature 97.6, blood pressure  157/101, pulse 95, respiratory 21, O2 sat is 97% on room air.  Pertinent physical examination:  GENERAL:  Alert, oriented, mild mental retardation.  Dry mucous  membrane.  ABDOMEN:  Soft.  Slight CVA tenderness.  NEUROLOGIC:  Grossly intact.   At the time of admission his sodium was 120, potassium 5.6, chloride 93,  bicarb 22.5, BUN 8, creatinine 0.7, and glucose of more than 700.  UA  showed a glucose of more than 100, without ketones.  Serum ketones were  negative.   PROBLEM LIST:  1. Nonketotic hyperosmolar state due to uncontrolled hypertension  His blood glucose was noted  be more than 700 with no ketones in the  urine and a small amount of blood ketones, and an anion gap of 12, this  was most likely not diabetic ketoacidosis, but non ketotic hyperosmolar  state.  He was admitted to the stepdown unit.  His  fluids was  aggressively replaced.  His electrolytes were also replaced.  He was  started on an insulin drip.  After adequate rehydration and insulin  treatment, his blood sugars improved to less than 200.  At that time, he  was transitioned to Lantus.  He was subsequently changed to his home  regimen of metformin and was also put on glipizide.  The exact etiology  for his acute episode was not known.  His urinalysis did not show a UTI.  His chest x-ray was normal and his cardiac enzymes were also normal.  His lipase was also normal at 17, and his liver function tests were  normal at the time of admission.  This could have been due to  noncompliance with medications because of his mild mental retardation.  1. Hypertension.  His blood pressure was slightly elevated during      admission.  He was put back on his home medications of Cozaar and      metoprolol.  He tolerated that well and his blood pressure remained      under fair control during admission.  2. Hypertriglyceridemia.  The patient was noted to have a very high      dry triglycerides level of 733, HDL of 44.  And, he was started on      Lopid 600 mg p.o. b.i.d.  He will also continue his Zocor 20 mg      daily.  His fasting lipid panel needs to be re assessed as an      outpatient.  3. Prolonged QT interval.  He was initially noted to have a prolonged      QT interval at the time of admission.  A repeat EKG showed      significant improvement.  This was most likely due to be the      metabolic disturbances.  4. Abdominal pain.  On the day prior to discharge, the patient      complained of abdominal pain.  An x-ray of the abdomen was done      which showed no abnormalities except for mild ileus.  A      comprehensive metabolic panel was checked which showed normal liver      function.  Amylase and lipase have also been  checked which were     normal.  This is most likely due to his GERD.  The patient was      given a GI cocktail  and has improved.  He will continue his      omeprazole 40 mg daily.  5. Schizophrenia.  The patient remained stable throughout      hospitalization.  He his on Tegretol 300 mg PO TID.  He will      continue that regimen.  He will follow up with his psychiatrist for      appropriate management.  His Tegretol level was not checked during      this admission.   Discharge blood pressure was 130/94, temperature was 98.6, pulse was 76.  His sodium was 126, potassium 4.8, chloride 95, bicarb 24, glucose 225,  BUN 9, creatinine 0.79.      Ronda Fairly, M.D.  Electronically Signed      Alvester Morin, M.D.  Electronically Signed    YB/MEDQ  D:  04/01/2007  T:  04/01/2007  Job:  [id number]   cc:   Catalina Welch, M.D.

## 2011-05-08 NOTE — Group Therapy Note (Signed)
NAME:  Jeremy Welch, Jeremy Welch                       ACCOUNT NO.:  [id number]   MEDICAL RECORD NO.:  [id number]                   PATIENT TYPE:  INP   LOCATION:  A214                                 FACILITY:  [facility]   PHYSICIAN:  Angus G. Renard Matter, M.D.              DATE OF BIRTH:  May 30, 1961   DATE OF PROCEDURE:  DATE OF DISCHARGE:                                   PROGRESS NOTE   SUBJECTIVE:  This patient was admitted with obstipation.  He did have slight  elevation of his troponin and will be seen by cardiology today.   OBJECTIVE:  Vital signs:  Blood pressure 132/85, respirations 18, pulse 86,  temperature 97.5.  Lungs are clear to P&A.  Heart regular rhythm.  Abdomen,  slightly distended.   ASSESSMENT/PLAN:  The patient was admitted with abdominal pain, chronic  constipation.  He does have schizophrenia and type 2 diabetes which is well  controlled.  He did have slight elevation of troponin which will be  evaluated by cardiology today.      ___________________________________________                                            Ishmael Holter. Renard Matter, M.D.   AGM/MEDQ  D:  03/24/2004  T:  03/24/2004  Job:  [id number]

## 2011-05-08 NOTE — Discharge Summary (Signed)
NAME:  Jeremy Welch, Jeremy Welch                         ACCOUNT NO.:  [id number]   MEDICAL RECORD NO.:  [id number]                  PATIENT TYPE:   LOCATION:                                       FACILITY:   PHYSICIAN:  Tesfaye D. Felecia Shelling, M.D.              DATE OF BIRTH:   DATE OF ADMISSION:  12/13/2003  DATE OF DISCHARGE:  12/15/2003                                 DISCHARGE SUMMARY   DISCHARGE DIAGNOSIS:  Abdominal pain secondary to constipation.   SECONDARY DIAGNOSES:  1. Diabetes mellitus, type 2.  2. Mental retardation with behavioral problems.   DISCHARGE MEDICATIONS:  1. Protonix 40 mg p.o. daily.  2. Lotensin 300 mg p.o. daily.  3. Glucotrol 10 mg p.o. q.a.m. and 5 mg p.o. q.p.m.  4. Senokot 1 tablet p.o. daily.  5. Risperdal 0.5 mg p.o. q.h.s.  6. Trileptal 300 mg p.o. q.h.s.   DISPOSITION:  The patient is discharged home in stable condition.   HOSPITAL COURSE:  This is a 50 year old male patient with a history of  diabetes mellitus type 2 and mental retardation with abnormal behavior.  He  was admitted on December 13, 2003 by Dr. Renard Matter due to severe abdominal  pain and constipation.  The patient underwent CT scan of the abdomen which  showed fatty liver and severe constipation.  The patient received laxatives  and his bowels were able to move.  The patient was relieved from his  abdominal pain.  The patient is being discharged home in a stable condition  to continue follow up with Dr. Butch Penny.     ___________________________________________                                         Eustaquio Maize. Felecia Shelling, M.D.   TDF/MEDQ  D:  12/15/2003  T:  12/15/2003  Job:  [id number]   cc:   Angus G. Renard Matter, M.D.  [address]  [place]  [place] [address]  Fax: [phone]
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
## 2011-05-08 NOTE — Group Therapy Note (Signed)
NAME:  Jeremy Welch, Jeremy Welch                       ACCOUNT NO.:  [id number]   MEDICAL RECORD NO.:  [id number]                   PATIENT TYPE:  INP   LOCATION:  A214                                 FACILITY:  [facility]   PHYSICIAN:  Mila Homer. Sudie Bailey, M.D.           DATE OF BIRTH:  11-16-1961   DATE OF PROCEDURE:  DATE OF DISCHARGE:                                   PROGRESS NOTE   SUBJECTIVE:  He says he is having no problems today.  This patient is a 50-  year-old man who has schizophrenia, noninsulin dependent diabetes, and a  lazy bowel with persistent complaint of right lower-quadrant pain, but he is  pain-free today.   OBJECTIVE:  Temperature 97.4, pulse 97, respiratory rate 20, blood pressure  126/80.  He appears to be alert.  He is in no acute distress, well-developed  and obese.  The heart has a regular rhythm without murmur, rate of 90.  The  lungs are clear throughout, moving air well.  Abdomen is soft and obese  without hepatosplenomegaly or mass.  His height is 69 inches, weight 197  pounds.  O2 saturations on two liters is 96%.  Glucose has been 207, 174,  193 and 159.  White cell count done yesterday was normal.  MET7 showed a  sodium of 132, glucose 263.  Cardiac enzymes:  CK 74, MB 3.0, troponin 0.06.   ASSESSMENT:  1. Abdominal pain, no obvious causes for this.  He has had GI workup.  2. Chronic constipation.  3. Schizophrenia.  4. Type 2 diabetes.  5. Obesity.  6. Depression.   PLAN:  1. Continue Cymbalta 60 mg daily with Trileptal 300 mg b.i.d., Risperdal     q.h.s., simvastatin 20 mg daily, MiraLax 17 grams in fluid daily,     Pantoprazole 40 mg b.i.d., metformin 500 mg b.i.d., glyburide 5 mg     b.i.d., Zetia 10 mg daily.  2. Discussed with him possibility  he will be discharged home tomorrow.      ___________________________________________                                            Mila Homer Sudie Bailey, M.D.   SDK/MEDQ  D:  03/22/2004  T:  03/22/2004  Job:   [id number]
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
## 2011-05-08 NOTE — Procedures (Signed)
NAME:  ORVELL, CAREAGA                       ACCOUNT NO.:  [id number]   MEDICAL RECORD NO.:  [id number]                   PATIENT TYPE:  INP   LOCATION:                                       FACILITY:   PHYSICIAN:  Vida Roller, M.D.                DATE OF BIRTH:  [birth year]   DATE OF PROCEDURE:  DATE OF DISCHARGE:  03/25/2004                                    STRESS TEST   INDICATION:  Mr. Oki is a 50 year old male with no known coronary  artery disease with atypical chest pain.  Cardiac enzymes are negative x3  for acute myocardial infarction.  EKG revealed no ischemia.  Cardiac risk  factors include diabetes and history of hyperlipidemia.   BASELINE DATA:  EKG revealed sinus rhythm at 80 beats per minute with  nonspecific ST abnormalities.  Blood pressure 122/90.   DESCRIPTION OF PROCEDURE:  The patient exercised for a total of 9 minutes  through Bruce protocol stage 3 and 10.1 mets.  Maximal heart rate was 158  beats per minute which is 88% of predicted maximum.  The maximum blood  pressure was 168/98.  The patient reported he continued to have chest  discomfort prior to exercise, through exercise and in recovery as well.  The  test was stopped secondary to fatigue.  EKG revealed no ischemic changes and  no arrhythmias.   FINAL IMAGES AND RESULTS:  Pending M.D. review.     ________________________________________  ___________________________________________  Jae Dire, P.A. [facility]                      Vida Roller, M.D.   AB/MEDQ  D:  03/25/2004  T:  03/26/2004  Job:  [id number]

## 2011-05-08 NOTE — Group Therapy Note (Signed)
NAME:  Jeremy Welch, Jeremy Welch                       ACCOUNT NO.:  [id number]   MEDICAL RECORD NO.:  [id number]                   PATIENT TYPE:  INP   LOCATION:  A214                                 FACILITY:  [facility]   PHYSICIAN:  Angus G. Renard Matter, M.D.              DATE OF BIRTH:  [birth year]   DATE OF PROCEDURE:  DATE OF DISCHARGE:                                   PROGRESS NOTE   This patient was admitted to the hospital with abdominal pain, thought to be  obstipation.  He had some chest pain as well.  CT of the abdomen showed  fatty changes in the liver.  CT of the pelvis showed moderate retained stool  within the colon.  Otherwise negative CT of pelvis.  Cardiac enzyme series  within normal limits.  CPK/MB 3.6, troponin 0.01.   OBJECTIVE:  VITAL SIGNS:  Blood pressure 90/50, respirations 22, pulse 73,  temperature 96.4.  LUNGS:  Clear to P&A.  HEART:  Regular rhythm.  ABDOMEN:  Distended, some tenderness.   ASSESSMENT:  Patient admitted with abdominal pain and chest pain.  The  patient does have a history of schizophrenia, mental retardation, type 2  diabetes, obstipation.   PLAN:  To continue evacuation of stool from colon.  Will obtain GI consult  as well.      ___________________________________________                                            Ishmael Holter. Renard Matter, M.D.   AGM/MEDQ  D:  03/21/2004  T:  03/21/2004  Job:  [id number]
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
## 2011-05-08 NOTE — Group Therapy Note (Signed)
NAME:  Jeremy Welch, Jeremy Welch                       ACCOUNT NO.:  [id number]   MEDICAL RECORD NO.:  [id number]                   PATIENT TYPE:  INP   LOCATION:  A304                                 FACILITY:  [facility]   PHYSICIAN:  Angus G. Renard Matter, M.D.              DATE OF BIRTH:  12-Feb-1961   DATE OF PROCEDURE:  DATE OF DISCHARGE:                                   PROGRESS NOTE   This patient was admitted to the hospital with severe midabdominal pain and  abdominal distention.  The patient had a CT of the abdomen which apparently  showed evidence of marked amount of stool in intestine, but no other acute  abnormalities.   CBC:  WBC 7600, hemoglobin 14.7, hematocrit 42.5, chemistries within normal  range.  Urinalysis negative.   OBJECTIVE:  VITAL SIGNS:  Blood pressure 136/87, respirations 20, pulse 98,  temperature 98.4.  LUNGS:  Clear to P&A.  HEART:  Regular rhythm.  ABDOMEN:  Slightly distended.   ASSESSMENT:  The patient admitted with severe midabdominal pain.  CT of the  abdomen revealed a fatty liver, severe constipation, no other acute  findings.   PLAN:  Plan to proceed with GoLYTELY today.      ___________________________________________                                            Ishmael Holter. Renard Matter, M.D.   AGM/MEDQ  D:  12/14/2003  T:  12/14/2003  Job:  [id number]

## 2011-05-08 NOTE — Cardiovascular Report (Signed)
NAME:  Jeremy Welch, Jeremy Welch                       ACCOUNT NO.:  [id number]   MEDICAL RECORD NO.:  [id number]                   PATIENT TYPE:  INP   LOCATION:  2018                                 FACILITY:  [facility]   PHYSICIAN:  Carole Binning, M.D. [facility]         DATE OF BIRTH:  [birth year]   DATE OF PROCEDURE:  03/26/2004  DATE OF DISCHARGE:                              CARDIAC CATHETERIZATION   PROCEDURE:  Left heart catheterization, coronary angiography and left  ventriculography.   INDICATIONS:  Mr. Murnane is a 50 year old male with cardiac risk factors  or diabetes and dyslipidemia.  He presented with atypical chest pain.  A  stress nuclear study was interpreted as revealing ejection fraction of 41%  and resting perfusion defect consistent with ischemic heart disease.  He had  a borderline troponin level.  He was therefore referred for cardiac  catheterization.   PROCEDURE:  A 6-French sheath was placed in the right femoral artery.  Coronary angiography was performed, and standard Judkins 6-French catheter.  Left ventriculography was performed with an angled pigtail catheter.  Contrast was Omnipaque.  There were no complications.   RESULTS:   HEMODYNAMICS:  Left ventricular pressure 110/12, aortic pressure 124/96.  There is no aortic valve gradient.   LEFT VENTRICULOGRAM:  Wall motion is normal.  Ejection fraction is estimated  at greater than or equal to 60%.  There is trace mitral regurgitation.   CORONARY ARTERIOGRAPHY:  Right dominant.   LEFT MAIN:  The left main is normal.  LEFT ANTERIOR DESCENDING ARTERY:  Gives rise to two small-to-normal size  diagonal branches.  The LAD is normal.  LEFT CIRCUMFLEX:  Gives rise to a small first-obtuse marginal branch, a  large second obtuse marginal branch and a small third obtuse marginal  branch.  The left circumflex is normal.  RIGHT CORONARY ARTERY:  Gives rise to a large, acute marginal branch, normal  size posterior  descending artery and a normal size posterolateral branch.  The right coronary artery is normal.   IMPRESSION:  1. Normal left ventricular systolic function.  2. No significant coronary artery disease identified.                                               Carole Binning, M.D. [facility]    MWP/MEDQ  D:  03/26/2004  T:  03/26/2004  Job:  [id number]   cc:   Angus G. Renard Matter, M.D.  [address]  [place]  [place] [address]  Fax: [phone]   Vida Roller, M.D.  Fax: [phone]   Cardiac Catheterization Lab

## 2011-05-08 NOTE — Consult Note (Signed)
NAME:  Jeremy Welch, Jeremy Welch                       ACCOUNT NO.:  [id number]   MEDICAL RECORD NO.:  [id number]                   PATIENT TYPE:  INP   LOCATION:  A214                                 FACILITY:  [facility]   PHYSICIAN:  Lionel December, M.D.                 DATE OF BIRTH:  1960-12-22   DATE OF CONSULTATION:  03/21/2004  DATE OF DISCHARGE:                                   CONSULTATION   REASON FOR CONSULTATION:  Right-sided abdominal pain.   HISTORY OF PRESENT ILLNESS:  Jeremy Welch is a 50 year old Caucasian male who was  admitted to Dr. Renard Matter' service for chest and right-sided abdominal pain  yesterday.  The patient states that the pain started yesterday and has  continued through this afternoon.  As far as his abdominal pain is concerned  he points to his right lower quadrant.  He rates it as 10/10.  He does give  a history of constipation.  He had abdominal CT on admission which was  negative other than evidence of constipation.  He states that he did have a  good bowel movement today although it did not alleviate his pain.  He denies  nausea, vomiting, fever, chills, melena or rectal bleeding.  The patient's  records reveal that he has had this pain off and on for a few years.  The  most recent evaluation was in September 2004 by Dr. Jena Gauss.  He had EGD and a  colonoscopy.  EGD reveals ulcerative esophagitis involving the distal half  of the esophagus.  Colonoscopy was incomplete due to poor prep, and he was  therefore brought back for a barium enema which was within normal limits.  He also complains of constant chest pain which he rates at 10/10.  It was  not relieved with nitroglycerin and the nursing staff has just given him  some morphine.  He denies palpitations or dyspnea.  He also denies heartburn  or dysphagia. He has a good appetite.  He is not sure whether or not he has  lost or gained any weight recently.   HOME MEDICATIONS:  1. His usual medications at home include  Gabitril 4 mg b.i.d.  2. Cymbalta 60 mg daily.  3. Trileptal 3 mg b.i.d.  4. Vytorin 10/20 daily.  5. Glucophage 500 mg b.i.d.   HOSPITAL MEDICATIONS:  1. While in the hospital he is on Cymbalta, Metformin and Trileptal at above     dose.  2. Zetia 10 mg daily.  3. Zocor 20 mg daily.  4. Risperdal 1 mg q.h.s.  5. Morphine sulfate 4 mg IV q.4h. p.r.n.  6. Nitroglycerin 0.4 mg sublingual p.r.n. chest pain.   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  He has had surgery on his left shoulder.  He  has had  right inguinal herniorrhaphy and cholecystectomy.   ALLERGIES:  NK.   FAMILY HISTORY:  Noncontributory.  He has 2 brothers.  His father had  surgery for colorectal carcinoma at age 71.  One brother may have heart  disease.   SOCIAL HISTORY:  He is single, he lives with his parents.  He works at  [employer] usually once a week.  He states that he did GED last year.  He  does not smoke cigarettes and is unable to read or write.   PHYSICAL EXAMINATION:  GENERAL:  A pleasant, mildly obese, Caucasian male  who is in no acute distress.  VITAL SIGNS:  He weighs 197.9 pounds.  He is 5 feet 9 inches tall.  Pulse  90/minute, blood pressure 130/82, respiratory rate is 20 and temperature is  97.2.  HEENT:  Conjunctivae are pink.  Sclerae is nonicteric.  Oropharyngeal mucosa  is normal.  Dentition is in satisfactory condition.  NECK:  Without masses or thyromegaly.  CARDIAC:  Exam with regular rhythm.  Normal S1 and S2.  No murmur or gallop  noted.  LUNGS:  Clear to auscultation.  ABDOMEN:  Very protuberant, bowel sounds are normal.  Palpation reveals soft  abdomen with tympanitic percussion note more on the right than on the left  with mild tenderness at RLQ.  No organomegaly or masses  noted.  RECTAL:  Examination deferred.  He does not have peripheral edema or  clubbing.   LABS:  On admission, WBC 6.9, H&H 15.8 and 45.3, platelet count 256,000.  Sodium 135, potassium 4.2, chloride 99, CO2 28, glucose 153, BUN 10,  creatinine 0.8, bilirubin 0.5.  AP 83, AST 30, ALT 33, albumin 3.9.  Calcium  9.3.  LFTs from this morning are also normal as is his H&H.   Troponin levels first and second one were 0.01 and 1 from 9:26 a.m. was 0.06  which is mildly elevated.  Urinalysis within normal limits.   Abdominal CT findings as above, plus showed diffuse fatty liver.  Findings  are as follows:  He has diffuse fatty change within the liver; appendix is  normal; there is no adenopathy; he has a moderate amount of stool within the  colon.   ASSESSMENT:  1. Right-sided abdominal pain.  This clearly appears to be variant of IBS     associated with constipation.  He has had this pain off and on for the     past few years and has undergone multiple evaluations.  His colonoscopy     and barium enema were 7 months ago. I do not feel that these studies need     to be repeated.  Because of family history of colon he should have     another colonoscopy in 4-1/2 years or so.  He needs to be a bowel     regimen.  2. Chest pain. He is being evaluated by Dr. Renard Matter.  His pain is not     typical of gastroesophageal reflux disease, but he has known history of     ulcerative esophagitis and presently not on any therapy.   RECOMMENDATIONS:  1. Will start him on MiraLax 17 gm p.o. q.h.s.  2. He will also need to be a on high fiber diet and fiber supplement when he     goes home.  3. Levsin SL t.i.d. p.r.n.  4. Will start him on Protonix 40 mg p.o. b.i.d.  5. The patient will be reevaluated in the a.m.   I  would like to thank Dr. Renard Matter for the opportunity to participate in the  care of this gentleman.     ___________________________________________                                             Lionel December, M.D.   NR/MEDQ  D:  03/21/2004  T:  03/22/2004  Job:  [id number]

## 2011-05-08 NOTE — Group Therapy Note (Signed)
NAME:  KEITHEN, CAPO             ACCOUNT NO.:  [id number]   MEDICAL RECORD NO.:  [id number]           PATIENT TYPE:   LOCATION:  A209                           FACILITY:   PHYSICIAN:  Angus G. Renard Matter, M.D.      DATE OF BIRTH:   DATE OF PROCEDURE:  DATE OF DISCHARGE:                                   PROGRESS NOTE   PROBLEM:  This patient was admitted with what appeared to be a seizure.  Was  given IV Valium in the ED.  He does have a history of mental retardation,  diabetes, schizophrenia, chronic constipation, obstipation, abdominal pain.   PHYSICAL EXAMINATION:  VITAL SIGNS:  Blood pressure 149/94, respirations 20,  pulse 88, temperature 98.  LUNGS:  Clear to percussion and auscultation.  HEART:  Regular rhythm.  ABDOMEN:  No palpable organs or masses.   ASSESSMENT:  The patient was admitted following seizure activity.  Does have  a history of obstipation.   PLAN:  Continue current regimen and obtain abdominal CT scan.       ___________________________________________  Ishmael Holter. Renard Matter, M.D.    AGM/MEDQ  D:  09/22/2004  T:  09/22/2004  Job:  [id number]

## 2011-05-08 NOTE — H&P (Signed)
NAME:  Leverne, Amrhein NO.:  [id number]   MEDICAL RECORD NO.:  [id number]                    PATIENT TYPE:   LOCATION:                                       FACILITY:   PHYSICIAN:  R. Roetta Sessions, M.D.              DATE OF BIRTH:  [birth year]   DATE OF ADMISSION:  08/28/2003  DATE OF DISCHARGE:                                HISTORY & PHYSICAL   CHIEF COMPLAINT:  Problem swallowing, constipation.   HISTORY OF PRESENT ILLNESS:  Jeremy Welch is a 50 year old, mentally challenged  gentleman who presents today for further evaluation of above-stated  symptoms.  He is accompanied by his mother.  He was most recently seen at  time of EGD in May 2004 to further evaluate questionable gastric mass seen  on the CT scan.  He was also having right upper quadrant abdominal pain and  gastric pain.  EGD revealed a couple of tiny distal esophageal erosions  consistent with mild reflux esophagitis.  He had a small hiatal hernia.  Otherwise, no abnormalities were seen.  The patient tells me that he had  difficulty swallowing solid foods and pills for the last three to four weeks  and still seems to get lodged in esophagus when he takes it.  He complains  of odynophagia when this occurs.  Denies any heartburn symptoms, nausea or  vomiting, abdominal pain.  He continues to have constipation issues and  usually has two hard stools weekly.  He was given MiraLax which helped  initially but has stopped taking this for palatable reasons.  Denies any  melena or rectal bleeding.  Nexium and Motrin were stopped as well for  unclear reasons recently.   He was hospitalized a few weeks ago at [facility] by Dr.  Jerre Simon for urinary retention.  He also had asymptomatic tachycardia at that  time.  He underwent echocardiogram which revealed trace tricuspid  regurgitation but otherwise unremarkable.  He also underwent cystoscopy  which was normal.  He had a screening CT which  revealed a possible small  left inguinal hernia and prominent stool throughout the colon.   The patient called a week ago complaining of difficulty swallowing.  We  ordered a barium pill esophogram which revealed normal esophageal motility  with no evidence of hiatal hernia or gastroesophageal reflux.  A 13 mm  barium pill passed without difficulty.  Esophageal mucosa folds appeared  normal.  The patient took large volume swallows of liquid and coughed a few  times, but they did not see any definite aspiration.   CURRENT MEDICATIONS:  1. Levoxyl 100 mg b.i.d.  2. MiraLax as directed.  3. Glipizide 10 mg daily.  4. Zocor 20 mg daily.  5. Gabatril 4 mg t.i.d.   ALLERGIES:  No known drug allergies.   PAST MEDICAL HISTORY:  Chronic constipation.  He underwent screening  colonoscopy in 1996 which was normal.  He has had multiple x-rays and CTs  done in the past which revealed evidence of constipation. Chronic abdominal  pain.  He has had multiple EGDs previously showing reflux esophagitis as  well as patulous EG junction.  He had a normal small-bowel follow-through in  1992 and normal hepatobiliary scan and abdominal pelvic CT at that time as  well.  He eventually underwent laparoscopic cholecystectomy for what appears  to be a biliary dyskinesia in 1998.  Last EGD is outlined above.  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  He had surgery for a left clavicle fracture.  History of  esophageal stricture and history of esophageal dysphagia in the past which  responded to dilatation.   FAMILY HISTORY:  Father underwent colectomy for colorectal cancer at age 80.   SOCIAL HISTORY:  Single, lives with his parents.  He does not smoke or use  alcohol.  He is able to read but not able to write.  He has no children.   REVIEW OF SYSTEMS:  Please see HPI  for GI.  GENERAL:  Denies weight loss.  CARDIOPULMONARY:  Denies chest pain or shortness of breath.   PHYSICAL EXAMINATION:  VITAL SIGNS:  Weight 196, up from 182. Blood pressure  120/68, pulse 82.  GENERAL:  Pleasant, well nourished, well developed Caucasian male in no  acute distress.  Accompanied by mother.  SKIN:  Warm and dry, no jaundice.  HEENT:  Conjunctivae pink. Sclerae nonicteric.  Oropharynx moist and pink.  No lesions, erythema, or exudate.  No lymphadenopathy, thyromegaly.  CHEST:  Lungs clear to auscultation.  CARDIAC:  Regular rate and rhythm.  Normal S1 and S2.  No murmurs, rubs, or  gallops.  ABDOMEN:  Positive bowel sounds.  Obese but symmetrical, soft.  He has mild  tenderness in the epigastric region to deep palpation.  No organomegaly or  masses.  No rebound tenderness or guarding.  EXTREMITIES:  No edema.   IMPRESSION:  1. Maysin has developed dysphagia to solid foods and pills over the last three     to four weeks.  Barium pill esophagram was unremarkable.  He is also     complaining of odynophagia.  Given the persistence of his symptoms and     after discussion with Dr. Jena Gauss, will offer repeat EGD with empiric     dilatation in the near future.  If he does not respond to this, then we     may need to consider further workup including esophageal manometry study.  2. Chronic constipation.  3. Family history of colorectal cancer in father at young age.  His last     colonoscopy was eight years ago and at this time recommend colonoscopy     primarily for high-risk screening purposes.  4. History of mild reflux esophagitis on prior esophagogastroduodenoscopy.     He is no longer on Nexium for unclear reasons.  Denies any difficult     gastroesophageal reflux disease symptoms.   PLAN:  1. EGD with dilatation and colonoscopy in the near future. 2. I have instructed him to take MiraLax as prescribed.  For the next two     days, however, he will take two doses      daily and then resume one dose daily.  He may ultimately need to go back     on a PPI  therapy but will await upcoming EGD prior to initiating this.  3. Given history of mild tricuspid regurgitation, will provide SBE     prophylaxis.        Tana Coast, Pricilla Larsson, M.D.    LL/MEDQ  D:  08/28/2003  T:  08/28/2003  Job:  [id number]   cc:   Angus G. Renard Matter, M.D.  [address]  [place]  [place] [address]  Fax: [phone]

## 2011-05-08 NOTE — H&P (Signed)
NAME:  Jeremy Welch, Jeremy Welch NO.:  [id number]   MEDICAL RECORD NO.:  [id number]          PATIENT TYPE:  INP   LOCATION:  A209                          FACILITY:  [facility]   PHYSICIAN:  Melvyn Novas, MDDATE OF BIRTH:  1961/05/10   DATE OF ADMISSION:  09/21/2004  DATE OF DISCHARGE:  LH                                HISTORY & PHYSICAL   HISTORY OF PRESENT ILLNESS:  The patient is a 50 year old, mentally  retarded, white male with a history of schizophrenia who was seen in the ER  last three nights for abdominal pain, constipation and subsequently sent  home.  He was given magnesium citrate.  Apparently today he had a shaking  spell, partially awake, which persisted for 20-30 minutes.  EMS was  summoned.  They stated it looked more like a Jacksonian type seizure than a  grand mal seizure.  He was given IV Valium and arrived in the emergency room  somewhat somnolent.  He had repeated leg jerking here while awake and  verbal.  Do not see any evidence of grand mal seizure at present.  He will  be admitted to control the above symptoms and to obtained an  electroencephalogram to discern the cause of his motor activity.  CT scan  was done in the ER which was essentially unremarkable.   PAST MEDICAL HISTORY:  1.  Mental retardation.  2.  Diabetes.  3.  Hyperlipidemia.  4.  Schizophrenia.  5.  GERD.  6.  Erosive esophagitis.  7.  Chronic constipation and obstipation.   CURRENT MEDICATIONS:  1.  Glucotrol XL 5 mg b.i.d.  2.  Trileptal 300 mg three tabs b.i.d.  3.  Glucophage 500 mg two in the a.m. and one in the p.m.  4.  Prevacid SoluTabs 30 mg per day.  5.  Gabitril 12 mg p.o. b.i.d.  6.  Cymbalta 60 mg daily.  7.  Levbid 0.375 b.i.d.   PHYSICAL EXAMINATION:  VITAL SIGNS:  Blood pressure 155/102, pulse 104 and  regular, respiratory rate 18, O2 saturation 97%.  HEENT:  Normocephalic, atraumatic.  PERRL.  Extraocular movements intact.  Sclerae clear.  Conjunctivae pink.  NECK:  No JVD.  No carotid bruits.  No thyromegaly.  LUNGS:  Clear to A&P.  No rales, wheezes or rhonchi appreciable.  HEART:  Regular rhythm.  No murmur, gallops, heaves, thrills, or rubs.  ABDOMEN:  Obese, soft, nontender.  Normal bowel sounds.  No  hepatosplenomegaly.  EXTREMITIES:  No clubbing, cyanosis or edema.  NEUROLOGIC:  The patient moves all four extremities.  Plantars are  downgoing.   IMPRESSION:  1.  Shaking spell, question of a seizure.  2.  Chronic constipation and obstipation.  3.  Mental retardation and schizophrenia.  4.  Diabetes.  5.  Hyperlipidemia.   PLAN:  Admit, obtain electroencephalogram.  Venous access should seizures  ensue, a.c. and h.s. glucose.  Get hemoglobin A1C, lipid profile, TSH.  Continue most of his current medicines and I will make further  recommendations as the data base expands.      RMD/MEDQ  D:  09/21/2004  T:  09/21/2004  Job:  [id number]

## 2011-05-08 NOTE — Discharge Summary (Signed)
NAME:  Jeremy Welch, Jeremy Welch             ACCOUNT NO.:  [id number]   MEDICAL RECORD NO.:  [id number]          PATIENT TYPE:  INP   LOCATION:  A209                          FACILITY:  [facility]   PHYSICIAN:  Angus G. Renard Matter, M.D. DATE OF BIRTH:  08-15-1961   DATE OF ADMISSION:  09/21/2004  DATE OF DISCHARGE:  10/04/2005LH                                 DISCHARGE SUMMARY   HISTORY:  A 50 year old male admitted September 21, 2004, and discharged  September 23, 2004.   DIAGNOSES:  1.  Shaking spell, questionable seizure.  2.  Chronic constipation.  3.  Mental retardation.  4.  Schizophrenia.  5.  Diabetes mellitus type 1.  6.  Hyperlipidemia.   DISCHARGE CONDITION:  Stable.   HISTORY OF PRESENT ILLNESS:  A 50 year old mentally-retarded male who has a  history of schizophrenia who was seen in the ER for three nights with  abdominal pain, constipation.  He was given magnesium citrate.  He had a  shaking spell which persisted for 20-30 minutes.  EMS was summoned.  The  stated it looked more like Jacksonian-type seizure than a grand mall  seizure.  He had an IV of Valium. He arrived in the emergency room  somnolent.  He had repeated leg jerking. He was admitted for further  evaluation.  CT scan done in the emergency department was essentially  unremarkable.   PHYSICAL EXAMINATION:  An alert male with blood pressure 155/102, pulse 104,  respirations 18.  HEENT:  Eyes PERRLA, TM's negative.  Oropharynx is benign.  Neck supple, no JVD or thyroid abnormalities.  Lungs clear to P&  A.  No rales or wheezes.  Heart is regular rhythm, no murmurs.  Abdomen, no  palpable organs or masses, somewhat distended.  No organomegaly.  Extremities free of edema.  Neurological:  The patient moves all four  extremities.  No motor or sensory weakness.   LABORATORY DATA:  Admission CBC:  WBC 7400, hemoglobin 15, hematocrit 44.  Chemistries:  Sodium 134, potassium 4.3, chloride 101, CO2 31, glucose 213,  BUN 11,  creatinine 0.9.  Calcium 8.3.  Total protein 6.1.  Albumin 3.8.  SGOT 27.  SGPT 24.  Alkaline phosphatase 107.  Drug screen positive for  opiates.  Urinalysis negative.   X-ray of the abdomen, no acute abnormalities.  Head CT negative.  CT scan of  the abdomen essentially negative.  Heart, normal sinus rhythm.   HOSPITAL COURSE:  The patient at the time of his admission, was placed on an  1800 calorie ADA diet.  His regular medications were continued.  Glucotrol  XL 5 mg b.i.d., Trileptal 300 mg b.i.d., Glucophage 500 mg, two in the  morning and one p.m.  Prevacid SoluTab 30 mg daily, Gabitril 12 mg b.i.d.,  Cymbalta 60 mg daily, Levbid 0.375 b.i.d.  The patient has had no further  seizure activity following his admission to the hospital.  Abdominal CT was  essentially negative.  The patient does have  a history of obstipation.  EEG  was scheduled.  The patient remained in the hospital setting for two days  and was  discharged on the second hospital day to be followed up as an  outpatient.  He will be seen by a neurologist.  EEG was ordered.   DISCHARGE DIET:  The patient was discharged on a 2000 calorie ADA diet.   DISCHARGE MEDICATIONS:  1.  Glucotrol XL 5 mg b.i.d.  2.  Trileptal 300 mg b.i.d.  3.  Glucophage XR 500 mg, two in the morning and one in the p.m.  4.  Prevacid SoluTabs 30 mg daily.  5.  Gabitril 12 mg b.i.d.  6.  Cymbalta 60 mg daily.  7.  Levbid 0.375 mg b.i.d.  8.  Dilantin 100 mg two h.s.  9.  P.r.n. Darvocet  10. FiberCon one b.i.d.      AGM/MEDQ  D:  10/14/2004  T:  10/14/2004  Job:  [id number]

## 2011-05-08 NOTE — H&P (Signed)
NAME:  Jeremy Welch, Jeremy Welch                       ACCOUNT NO.:  [id number]   MEDICAL RECORD NO.:  [id number]                   PATIENT TYPE:  INP   LOCATION:  A214                                 FACILITY:  [facility]   PHYSICIAN:  Angus G. Renard Matter, M.D.              DATE OF BIRTH:  11-03-1961   DATE OF ADMISSION:  03/20/2004  DATE OF DISCHARGE:                                HISTORY & PHYSICAL   A 50 year old white male who was admitted with the chief complaint being  upper abdominal pain and anterior chest pain. This patient was seen in the  office prior to admission with a chief complaint being anterior chest pain  and upper abdominal pain which had been present for the last 24 hours.  He  apparently came into the office because of increased pain and clammy feeling  prior to coming. He has had no vomiting, but has been intermittently  constipated. He has lazy bowel syndrome. The patient had been in the  hospital previously for multiple enemas and treatment for obstipation. An  electrocardiogram done in the office shows QS in lead III, poor R-wave  progression over the precordial leads. 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  It was felt he should be admitted for further evaluation of chest pain and  possible multiple enemas to correct obstipation.   SOCIAL HISTORY:  The patient does not smoke or drink alcohol.   FAMILY HISTORY:  See previous record.  The patient has positive family  history of heart disease, diabetes.   PAST MEDICAL/SURGICAL HISTORY:  The patient has a history of mild mental  retardation, schizophrenia, non-insulin-dependent diabetes, dyslipidemia,  lazy bowel syndrome. He has had previous surgery to include cholecystectomy,  hernia repair on the right, and history of fractured right clavicle. His  only medications include Gabitril 4 mg two daily, Cymbalta 60 mg daily,  Trileptal 300 mg daily, Vytorin 10/20  daily, Risperdal 1 mg q.h.s.,  Glucophage XR 500 mg b.i.d.   REVIEW OF SYSTEMS:  HEENT: Negative. CARDIOPULMONARY: Anterior chest pain.  GI: No vomiting or diarrhea. Patient has had decreased quantity of stool  over the past few days and abdominal pain. GU: No dysuria or hematuria.   PHYSICAL EXAMINATION:  GENERAL/VITAL SIGNS: Alert, retarded white male with  blood pressure of 120/80, pulse 60, respirations 18, temperature 98.  HEENT: PERRLA. TMs negative. Oropharynx is negative.  NECK: Supple with no JVD or thyroid abnormalities.  HEART: Regular rate and rhythm.  LUNGS: Clear to P&A.  ABDOMEN: Slightly distended with tenderness in the upper abdomen.  EXTREMITIES: Free of edema.  NEUROLOGIC: No focal deficits.   DIAGNOSES:  1. Anterior chest pain of undetermined etiology.  2. Abdominal pain probably secondary to obstipation.   OTHER DIAGNOSES:  1. Non-insulin-dependent diabetes mellitus.  2. Dyslipidemia.  3. Schizophrenia.     ___________________________________________  Angus G. Renard Matter, M.D.   AGM/MEDQ  D:  03/20/2004  T:  03/20/2004  Job:  [id number]

## 2011-09-11 LAB — CBC
HCT: 44.2
Hemoglobin: 15.6
MCHC: 35.2
MCV: 92.3
Platelets: 259
RBC: 4.79
RDW: 13
WBC: 9.5

## 2011-09-11 LAB — URINALYSIS, ROUTINE W REFLEX MICROSCOPIC
Bilirubin Urine: NEGATIVE
Glucose, UA: NEGATIVE
Hgb urine dipstick: NEGATIVE
Ketones, ur: >80 — AB
Nitrite: NEGATIVE
Protein, ur: NEGATIVE
Specific Gravity, Urine: 1.018
Urobilinogen, UA: 1
pH: 7

## 2011-09-11 LAB — RAPID URINE DRUG SCREEN, HOSP PERFORMED
Amphetamines: NOT DETECTED
Benzodiazepines: NOT DETECTED
Cocaine: NOT DETECTED
Opiates: NOT DETECTED
Tetrahydrocannabinol: NOT DETECTED

## 2011-09-11 LAB — DIFFERENTIAL
Basophils Absolute: 0
Basophils Relative: 0
Eosinophils Absolute: 0
Eosinophils Relative: 0
Lymphocytes Relative: 11 — ABNORMAL LOW
Lymphs Abs: 1
Monocytes Absolute: 0.7
Monocytes Relative: 7
Neutro Abs: 7.8 — ABNORMAL HIGH
Neutrophils Relative %: 82 — ABNORMAL HIGH

## 2011-09-11 LAB — HEPATIC FUNCTION PANEL
ALT: 13
AST: 19
Albumin: 4
Alkaline Phosphatase: 97
Bilirubin, Direct: 0.1
Indirect Bilirubin: 0.6
Total Bilirubin: 0.7
Total Protein: 6.2

## 2011-09-11 LAB — ETHANOL

## 2011-09-11 LAB — BASIC METABOLIC PANEL WITH GFR
CO2: 29
Chloride: 100
Glucose, Bld: 131 — ABNORMAL HIGH
Sodium: 141

## 2011-09-11 LAB — BASIC METABOLIC PANEL
BUN: 12
Calcium: 9.2
Creatinine, Ser: 0.89
GFR calc non Af Amer: >60
Potassium: 4.1

## 2011-09-11 LAB — CARBAMAZEPINE LEVEL, TOTAL: Carbamazepine Lvl: 10.1

## 2011-09-11 LAB — ACETAMINOPHEN LEVEL

## 2011-09-11 LAB — SALICYLATE LEVEL: Salicylate Lvl: <4

## 2011-10-02 LAB — CBC
HCT: 38.9 — ABNORMAL LOW
MCV: 92.1
RBC: 4.22
WBC: 4.2

## 2011-10-02 LAB — BASIC METABOLIC PANEL
CO2: 27
Calcium: 8.2 — ABNORMAL LOW
Chloride: 101
Sodium: 137

## 2011-10-02 LAB — DIFFERENTIAL
Eosinophils Absolute: 0.1
Lymphocytes Relative: 23
Lymphs Abs: 1
Monocytes Relative: 10
Neutrophils Relative %: 66

## 2011-10-07 LAB — DIFFERENTIAL
Basophils Relative: 0
Blasts: 0
Lymphocytes Relative: 6 — ABNORMAL LOW
Lymphs Abs: 1.5
Monocytes Relative: 6
Neutro Abs: 3.4
Neutrophils Relative %: 65
Neutrophils Relative %: 83 — ABNORMAL HIGH
Promyelocytes Absolute: 0

## 2011-10-07 LAB — URINALYSIS, ROUTINE W REFLEX MICROSCOPIC
Glucose, UA: >1000 — AB
Hgb urine dipstick: NEGATIVE
Hgb urine dipstick: NEGATIVE
Leukocytes, UA: NEGATIVE
Leukocytes, UA: NEGATIVE
Protein, ur: NEGATIVE
Specific Gravity, Urine: >1.03 — ABNORMAL HIGH
Urobilinogen, UA: 1
pH: 5.5

## 2011-10-07 LAB — BASIC METABOLIC PANEL
BUN: 11
Calcium: 8.3 — ABNORMAL LOW
Creatinine, Ser: 0.59
GFR calc Af Amer: >60

## 2011-10-07 LAB — LIPASE, BLOOD: Lipase: 28

## 2011-10-07 LAB — CBC
HCT: 47.6
Hemoglobin: 16.8
MCHC: 34.8
Platelets: 284
RBC: 4.78
RDW: 12.2
WBC: 5.3

## 2011-10-07 LAB — COMPREHENSIVE METABOLIC PANEL
Alkaline Phosphatase: 95
BUN: 15
CO2: 20
Chloride: 101
GFR calc non Af Amer: >60
Glucose, Bld: 378 — ABNORMAL HIGH
Potassium: 4.2
Total Bilirubin: 0.8
Total Protein: 6

## 2011-10-07 LAB — URINE MICROSCOPIC-ADD ON

## 2011-10-08 LAB — BASIC METABOLIC PANEL
BUN: 8
Calcium: 8.7
Chloride: 96
Creatinine, Ser: 0.77
GFR calc Af Amer: >60
GFR calc non Af Amer: >60

## 2011-10-08 LAB — DIFFERENTIAL
Basophils Absolute: 0
Basophils Relative: 0
Eosinophils Absolute: 0.1
Eosinophils Relative: 2
Lymphocytes Relative: 24
Lymphs Abs: 1.3
Monocytes Relative: 8
Neutro Abs: 3.6
Neutrophils Relative %: 66

## 2011-10-08 LAB — CBC
MCV: 90.5
Platelets: 206
RBC: 4.53
WBC: 5.5

## 2012-01-01 DIAGNOSIS — Z23 Encounter for immunization: Secondary | ICD-10-CM | POA: Diagnosis not present

## 2012-01-05 DIAGNOSIS — F429 Obsessive-compulsive disorder, unspecified: Secondary | ICD-10-CM | POA: Diagnosis not present

## 2012-01-18 DIAGNOSIS — I10 Essential (primary) hypertension: Secondary | ICD-10-CM | POA: Diagnosis not present

## 2012-01-18 DIAGNOSIS — E785 Hyperlipidemia, unspecified: Secondary | ICD-10-CM | POA: Diagnosis not present

## 2012-01-18 DIAGNOSIS — E119 Type 2 diabetes mellitus without complications: Secondary | ICD-10-CM | POA: Diagnosis not present

## 2012-01-18 DIAGNOSIS — K219 Gastro-esophageal reflux disease without esophagitis: Secondary | ICD-10-CM | POA: Diagnosis not present

## 2012-01-20 DIAGNOSIS — B351 Tinea unguium: Secondary | ICD-10-CM | POA: Diagnosis not present

## 2012-03-14 DIAGNOSIS — H2510 Age-related nuclear cataract, unspecified eye: Secondary | ICD-10-CM | POA: Diagnosis not present

## 2012-03-14 DIAGNOSIS — E119 Type 2 diabetes mellitus without complications: Secondary | ICD-10-CM | POA: Diagnosis not present

## 2012-04-07 DIAGNOSIS — F429 Obsessive-compulsive disorder, unspecified: Secondary | ICD-10-CM | POA: Diagnosis not present

## 2012-04-13 DIAGNOSIS — B351 Tinea unguium: Secondary | ICD-10-CM | POA: Diagnosis not present

## 2012-05-30 DIAGNOSIS — N400 Enlarged prostate without lower urinary tract symptoms: Secondary | ICD-10-CM | POA: Diagnosis not present

## 2012-07-07 DIAGNOSIS — F429 Obsessive-compulsive disorder, unspecified: Secondary | ICD-10-CM | POA: Diagnosis not present

## 2012-07-25 DIAGNOSIS — Z79899 Other long term (current) drug therapy: Secondary | ICD-10-CM | POA: Diagnosis not present

## 2012-07-25 DIAGNOSIS — E119 Type 2 diabetes mellitus without complications: Secondary | ICD-10-CM | POA: Diagnosis not present

## 2012-07-25 DIAGNOSIS — E785 Hyperlipidemia, unspecified: Secondary | ICD-10-CM | POA: Diagnosis not present

## 2012-07-25 DIAGNOSIS — I10 Essential (primary) hypertension: Secondary | ICD-10-CM | POA: Diagnosis not present

## 2012-09-29 DIAGNOSIS — F639 Impulse disorder, unspecified: Secondary | ICD-10-CM | POA: Diagnosis not present

## 2012-10-12 DIAGNOSIS — B351 Tinea unguium: Secondary | ICD-10-CM | POA: Diagnosis not present

## 2012-11-01 DIAGNOSIS — Z23 Encounter for immunization: Secondary | ICD-10-CM | POA: Diagnosis not present

## 2012-11-28 DIAGNOSIS — E785 Hyperlipidemia, unspecified: Secondary | ICD-10-CM | POA: Diagnosis not present

## 2012-11-28 DIAGNOSIS — E119 Type 2 diabetes mellitus without complications: Secondary | ICD-10-CM | POA: Diagnosis not present

## 2012-11-28 DIAGNOSIS — I10 Essential (primary) hypertension: Secondary | ICD-10-CM | POA: Diagnosis not present

## 2012-11-30 DIAGNOSIS — N320 Bladder-neck obstruction: Secondary | ICD-10-CM | POA: Diagnosis not present

## 2012-11-30 DIAGNOSIS — N400 Enlarged prostate without lower urinary tract symptoms: Secondary | ICD-10-CM | POA: Diagnosis not present

## 2012-12-20 DIAGNOSIS — M79609 Pain in unspecified limb: Secondary | ICD-10-CM | POA: Diagnosis not present

## 2012-12-28 DIAGNOSIS — F259 Schizoaffective disorder, unspecified: Secondary | ICD-10-CM | POA: Diagnosis not present

## 2012-12-28 DIAGNOSIS — F639 Impulse disorder, unspecified: Secondary | ICD-10-CM | POA: Diagnosis not present

## 2013-01-24 DIAGNOSIS — B351 Tinea unguium: Secondary | ICD-10-CM | POA: Diagnosis not present

## 2013-01-25 DIAGNOSIS — F639 Impulse disorder, unspecified: Secondary | ICD-10-CM | POA: Diagnosis not present

## 2013-01-25 DIAGNOSIS — F259 Schizoaffective disorder, unspecified: Secondary | ICD-10-CM | POA: Diagnosis not present

## 2013-03-27 DIAGNOSIS — F639 Impulse disorder, unspecified: Secondary | ICD-10-CM | POA: Diagnosis not present

## 2013-03-27 DIAGNOSIS — F259 Schizoaffective disorder, unspecified: Secondary | ICD-10-CM | POA: Diagnosis not present

## 2013-04-04 DIAGNOSIS — B351 Tinea unguium: Secondary | ICD-10-CM | POA: Diagnosis not present

## 2013-05-03 DIAGNOSIS — I10 Essential (primary) hypertension: Secondary | ICD-10-CM | POA: Diagnosis not present

## 2013-05-03 DIAGNOSIS — E119 Type 2 diabetes mellitus without complications: Secondary | ICD-10-CM | POA: Diagnosis not present

## 2013-05-03 DIAGNOSIS — Z0000 Encounter for general adult medical examination without abnormal findings: Secondary | ICD-10-CM | POA: Diagnosis not present

## 2013-05-03 DIAGNOSIS — E785 Hyperlipidemia, unspecified: Secondary | ICD-10-CM | POA: Diagnosis not present

## 2013-05-23 DIAGNOSIS — B351 Tinea unguium: Secondary | ICD-10-CM | POA: Diagnosis not present

## 2013-06-12 DIAGNOSIS — L738 Other specified follicular disorders: Secondary | ICD-10-CM | POA: Diagnosis not present

## 2013-06-12 DIAGNOSIS — N400 Enlarged prostate without lower urinary tract symptoms: Secondary | ICD-10-CM | POA: Diagnosis not present

## 2013-06-26 DIAGNOSIS — F639 Impulse disorder, unspecified: Secondary | ICD-10-CM | POA: Diagnosis not present

## 2013-06-26 DIAGNOSIS — F259 Schizoaffective disorder, unspecified: Secondary | ICD-10-CM | POA: Diagnosis not present

## 2013-10-04 DIAGNOSIS — F259 Schizoaffective disorder, unspecified: Secondary | ICD-10-CM | POA: Diagnosis not present

## 2013-10-04 DIAGNOSIS — F639 Impulse disorder, unspecified: Secondary | ICD-10-CM | POA: Diagnosis not present

## 2013-10-06 DIAGNOSIS — B351 Tinea unguium: Secondary | ICD-10-CM | POA: Diagnosis not present

## 2013-10-06 DIAGNOSIS — E1149 Type 2 diabetes mellitus with other diabetic neurological complication: Secondary | ICD-10-CM | POA: Diagnosis not present

## 2013-10-06 DIAGNOSIS — L851 Acquired keratosis [keratoderma] palmaris et plantaris: Secondary | ICD-10-CM | POA: Diagnosis not present

## 2013-10-23 DIAGNOSIS — Z23 Encounter for immunization: Secondary | ICD-10-CM | POA: Diagnosis not present

## 2013-10-31 DIAGNOSIS — I10 Essential (primary) hypertension: Secondary | ICD-10-CM | POA: Diagnosis not present

## 2013-10-31 DIAGNOSIS — E119 Type 2 diabetes mellitus without complications: Secondary | ICD-10-CM | POA: Diagnosis not present

## 2013-10-31 DIAGNOSIS — E785 Hyperlipidemia, unspecified: Secondary | ICD-10-CM | POA: Diagnosis not present

## 2013-10-31 DIAGNOSIS — J029 Acute pharyngitis, unspecified: Secondary | ICD-10-CM | POA: Diagnosis not present

## 2013-11-01 DIAGNOSIS — Z8249 Family history of ischemic heart disease and other diseases of the circulatory system: Secondary | ICD-10-CM | POA: Diagnosis not present

## 2013-11-01 DIAGNOSIS — R079 Chest pain, unspecified: Secondary | ICD-10-CM | POA: Diagnosis not present

## 2013-11-01 DIAGNOSIS — Z79899 Other long term (current) drug therapy: Secondary | ICD-10-CM | POA: Diagnosis not present

## 2013-11-01 DIAGNOSIS — I10 Essential (primary) hypertension: Secondary | ICD-10-CM | POA: Diagnosis not present

## 2013-11-01 DIAGNOSIS — Z833 Family history of diabetes mellitus: Secondary | ICD-10-CM | POA: Diagnosis not present

## 2013-11-01 DIAGNOSIS — R0789 Other chest pain: Secondary | ICD-10-CM | POA: Diagnosis not present

## 2013-11-01 DIAGNOSIS — Z794 Long term (current) use of insulin: Secondary | ICD-10-CM | POA: Diagnosis not present

## 2013-11-01 DIAGNOSIS — E119 Type 2 diabetes mellitus without complications: Secondary | ICD-10-CM | POA: Diagnosis not present

## 2013-11-09 DIAGNOSIS — S0990XA Unspecified injury of head, initial encounter: Secondary | ICD-10-CM | POA: Diagnosis not present

## 2013-11-09 DIAGNOSIS — S239XXA Sprain of unspecified parts of thorax, initial encounter: Secondary | ICD-10-CM | POA: Diagnosis not present

## 2013-11-09 DIAGNOSIS — S139XXA Sprain of joints and ligaments of unspecified parts of neck, initial encounter: Secondary | ICD-10-CM | POA: Diagnosis not present

## 2013-11-14 DIAGNOSIS — R0789 Other chest pain: Secondary | ICD-10-CM | POA: Diagnosis not present

## 2013-11-14 DIAGNOSIS — K219 Gastro-esophageal reflux disease without esophagitis: Secondary | ICD-10-CM | POA: Diagnosis not present

## 2013-12-28 DIAGNOSIS — R300 Dysuria: Secondary | ICD-10-CM | POA: Diagnosis not present

## 2013-12-28 DIAGNOSIS — L259 Unspecified contact dermatitis, unspecified cause: Secondary | ICD-10-CM | POA: Diagnosis not present

## 2014-01-01 DIAGNOSIS — R350 Frequency of micturition: Secondary | ICD-10-CM | POA: Diagnosis not present

## 2014-01-11 DIAGNOSIS — N400 Enlarged prostate without lower urinary tract symptoms: Secondary | ICD-10-CM | POA: Diagnosis not present

## 2014-01-15 DIAGNOSIS — R350 Frequency of micturition: Secondary | ICD-10-CM | POA: Diagnosis not present

## 2014-01-23 DIAGNOSIS — F639 Impulse disorder, unspecified: Secondary | ICD-10-CM | POA: Diagnosis not present

## 2014-01-23 DIAGNOSIS — F259 Schizoaffective disorder, unspecified: Secondary | ICD-10-CM | POA: Diagnosis not present

## 2014-01-26 DIAGNOSIS — L851 Acquired keratosis [keratoderma] palmaris et plantaris: Secondary | ICD-10-CM | POA: Diagnosis not present

## 2014-01-26 DIAGNOSIS — E1149 Type 2 diabetes mellitus with other diabetic neurological complication: Secondary | ICD-10-CM | POA: Diagnosis not present

## 2014-01-26 DIAGNOSIS — B351 Tinea unguium: Secondary | ICD-10-CM | POA: Diagnosis not present

## 2014-03-06 DIAGNOSIS — I10 Essential (primary) hypertension: Secondary | ICD-10-CM | POA: Diagnosis not present

## 2014-03-06 DIAGNOSIS — G40309 Generalized idiopathic epilepsy and epileptic syndromes, not intractable, without status epilepticus: Secondary | ICD-10-CM | POA: Diagnosis not present

## 2014-03-06 DIAGNOSIS — G44209 Tension-type headache, unspecified, not intractable: Secondary | ICD-10-CM | POA: Diagnosis not present

## 2014-03-06 DIAGNOSIS — L259 Unspecified contact dermatitis, unspecified cause: Secondary | ICD-10-CM | POA: Diagnosis not present

## 2014-03-06 DIAGNOSIS — E119 Type 2 diabetes mellitus without complications: Secondary | ICD-10-CM | POA: Diagnosis not present

## 2014-03-27 DIAGNOSIS — F639 Impulse disorder, unspecified: Secondary | ICD-10-CM | POA: Diagnosis not present

## 2014-03-27 DIAGNOSIS — F259 Schizoaffective disorder, unspecified: Secondary | ICD-10-CM | POA: Diagnosis not present

## 2014-04-05 DIAGNOSIS — F429 Obsessive-compulsive disorder, unspecified: Secondary | ICD-10-CM | POA: Diagnosis not present

## 2014-04-05 DIAGNOSIS — F909 Attention-deficit hyperactivity disorder, unspecified type: Secondary | ICD-10-CM | POA: Diagnosis not present

## 2014-04-05 DIAGNOSIS — F70 Mild intellectual disabilities: Secondary | ICD-10-CM | POA: Diagnosis not present

## 2014-05-02 DIAGNOSIS — F429 Obsessive-compulsive disorder, unspecified: Secondary | ICD-10-CM | POA: Diagnosis not present

## 2014-05-22 DIAGNOSIS — E785 Hyperlipidemia, unspecified: Secondary | ICD-10-CM | POA: Diagnosis not present

## 2014-05-22 DIAGNOSIS — I10 Essential (primary) hypertension: Secondary | ICD-10-CM | POA: Diagnosis not present

## 2014-05-22 DIAGNOSIS — Z79899 Other long term (current) drug therapy: Secondary | ICD-10-CM | POA: Diagnosis not present

## 2014-05-22 DIAGNOSIS — E119 Type 2 diabetes mellitus without complications: Secondary | ICD-10-CM | POA: Diagnosis not present

## 2014-05-22 DIAGNOSIS — Z5181 Encounter for therapeutic drug level monitoring: Secondary | ICD-10-CM | POA: Diagnosis not present

## 2014-05-30 DIAGNOSIS — F429 Obsessive-compulsive disorder, unspecified: Secondary | ICD-10-CM | POA: Diagnosis not present

## 2014-06-08 DIAGNOSIS — L851 Acquired keratosis [keratoderma] palmaris et plantaris: Secondary | ICD-10-CM | POA: Diagnosis not present

## 2014-06-08 DIAGNOSIS — E1149 Type 2 diabetes mellitus with other diabetic neurological complication: Secondary | ICD-10-CM | POA: Diagnosis not present

## 2014-06-08 DIAGNOSIS — B351 Tinea unguium: Secondary | ICD-10-CM | POA: Diagnosis not present

## 2014-07-19 DIAGNOSIS — G44209 Tension-type headache, unspecified, not intractable: Secondary | ICD-10-CM | POA: Diagnosis not present

## 2014-08-24 DIAGNOSIS — B351 Tinea unguium: Secondary | ICD-10-CM | POA: Diagnosis not present

## 2014-08-24 DIAGNOSIS — L851 Acquired keratosis [keratoderma] palmaris et plantaris: Secondary | ICD-10-CM | POA: Diagnosis not present

## 2014-08-24 DIAGNOSIS — E1149 Type 2 diabetes mellitus with other diabetic neurological complication: Secondary | ICD-10-CM | POA: Diagnosis not present

## 2014-09-05 DIAGNOSIS — F429 Obsessive-compulsive disorder, unspecified: Secondary | ICD-10-CM | POA: Diagnosis not present

## 2014-09-20 DIAGNOSIS — Z79899 Other long term (current) drug therapy: Secondary | ICD-10-CM | POA: Diagnosis not present

## 2014-09-20 DIAGNOSIS — E871 Hypo-osmolality and hyponatremia: Secondary | ICD-10-CM | POA: Diagnosis not present

## 2014-09-24 DIAGNOSIS — F70 Mild intellectual disabilities: Secondary | ICD-10-CM | POA: Diagnosis not present

## 2014-09-24 DIAGNOSIS — F42 Obsessive-compulsive disorder: Secondary | ICD-10-CM | POA: Diagnosis not present

## 2014-09-24 DIAGNOSIS — F639 Impulse disorder, unspecified: Secondary | ICD-10-CM | POA: Diagnosis not present

## 2014-10-24 DIAGNOSIS — F639 Impulse disorder, unspecified: Secondary | ICD-10-CM | POA: Diagnosis not present

## 2014-10-24 DIAGNOSIS — Z23 Encounter for immunization: Secondary | ICD-10-CM | POA: Diagnosis not present

## 2014-10-24 DIAGNOSIS — F70 Mild intellectual disabilities: Secondary | ICD-10-CM | POA: Diagnosis not present

## 2014-11-23 DIAGNOSIS — E1342 Other specified diabetes mellitus with diabetic polyneuropathy: Secondary | ICD-10-CM | POA: Diagnosis not present

## 2014-11-23 DIAGNOSIS — B351 Tinea unguium: Secondary | ICD-10-CM | POA: Diagnosis not present

## 2014-11-23 DIAGNOSIS — L851 Acquired keratosis [keratoderma] palmaris et plantaris: Secondary | ICD-10-CM | POA: Diagnosis not present

## 2014-11-26 DIAGNOSIS — F639 Impulse disorder, unspecified: Secondary | ICD-10-CM | POA: Diagnosis not present

## 2014-11-26 DIAGNOSIS — F70 Mild intellectual disabilities: Secondary | ICD-10-CM | POA: Diagnosis not present

## 2014-11-26 DIAGNOSIS — L851 Acquired keratosis [keratoderma] palmaris et plantaris: Secondary | ICD-10-CM | POA: Diagnosis not present

## 2014-11-26 DIAGNOSIS — B351 Tinea unguium: Secondary | ICD-10-CM | POA: Diagnosis not present

## 2014-11-26 DIAGNOSIS — G4700 Insomnia, unspecified: Secondary | ICD-10-CM | POA: Diagnosis not present

## 2014-11-26 DIAGNOSIS — G40409 Other generalized epilepsy and epileptic syndromes, not intractable, without status epilepticus: Secondary | ICD-10-CM | POA: Diagnosis not present

## 2014-11-26 DIAGNOSIS — E1342 Other specified diabetes mellitus with diabetic polyneuropathy: Secondary | ICD-10-CM | POA: Diagnosis not present

## 2014-12-20 DIAGNOSIS — E782 Mixed hyperlipidemia: Secondary | ICD-10-CM | POA: Diagnosis not present

## 2014-12-20 DIAGNOSIS — E785 Hyperlipidemia, unspecified: Secondary | ICD-10-CM | POA: Diagnosis not present

## 2014-12-20 DIAGNOSIS — E119 Type 2 diabetes mellitus without complications: Secondary | ICD-10-CM | POA: Diagnosis not present

## 2014-12-20 DIAGNOSIS — I10 Essential (primary) hypertension: Secondary | ICD-10-CM | POA: Diagnosis not present

## 2014-12-21 ENCOUNTER — Emergency Department: Payer: Self-pay | Admitting: Emergency Medicine

## 2014-12-21 DIAGNOSIS — H1031 Unspecified acute conjunctivitis, right eye: Secondary | ICD-10-CM | POA: Diagnosis not present

## 2014-12-21 DIAGNOSIS — Z79899 Other long term (current) drug therapy: Secondary | ICD-10-CM | POA: Diagnosis not present

## 2014-12-21 DIAGNOSIS — I10 Essential (primary) hypertension: Secondary | ICD-10-CM | POA: Diagnosis not present

## 2014-12-21 DIAGNOSIS — E119 Type 2 diabetes mellitus without complications: Secondary | ICD-10-CM | POA: Diagnosis not present

## 2014-12-21 DIAGNOSIS — H109 Unspecified conjunctivitis: Secondary | ICD-10-CM | POA: Diagnosis not present

## 2015-01-03 ENCOUNTER — Telehealth: Payer: Self-pay

## 2015-01-03 NOTE — Telephone Encounter (Signed)
Pt referred by Dr. Dwana MelenaZack Welch for screening colonoscopy. Called, many rings and no answer.

## 2015-01-18 NOTE — Telephone Encounter (Signed)
Letter to pt to call.  

## 2015-01-28 DIAGNOSIS — F639 Impulse disorder, unspecified: Secondary | ICD-10-CM | POA: Diagnosis not present

## 2015-01-28 DIAGNOSIS — F70 Mild intellectual disabilities: Secondary | ICD-10-CM | POA: Diagnosis not present

## 2015-02-06 DIAGNOSIS — B351 Tinea unguium: Secondary | ICD-10-CM | POA: Diagnosis not present

## 2015-02-06 DIAGNOSIS — E1342 Other specified diabetes mellitus with diabetic polyneuropathy: Secondary | ICD-10-CM | POA: Diagnosis not present

## 2015-02-06 DIAGNOSIS — L851 Acquired keratosis [keratoderma] palmaris et plantaris: Secondary | ICD-10-CM | POA: Diagnosis not present

## 2015-02-06 DIAGNOSIS — L609 Nail disorder, unspecified: Secondary | ICD-10-CM | POA: Diagnosis not present

## 2015-04-15 DIAGNOSIS — L609 Nail disorder, unspecified: Secondary | ICD-10-CM | POA: Diagnosis not present

## 2015-04-15 DIAGNOSIS — E1342 Other specified diabetes mellitus with diabetic polyneuropathy: Secondary | ICD-10-CM | POA: Diagnosis not present

## 2015-04-15 DIAGNOSIS — B351 Tinea unguium: Secondary | ICD-10-CM | POA: Diagnosis not present

## 2015-05-02 DIAGNOSIS — F639 Impulse disorder, unspecified: Secondary | ICD-10-CM | POA: Diagnosis not present

## 2015-05-02 DIAGNOSIS — F70 Mild intellectual disabilities: Secondary | ICD-10-CM | POA: Diagnosis not present

## 2015-06-17 DIAGNOSIS — I10 Essential (primary) hypertension: Secondary | ICD-10-CM | POA: Diagnosis not present

## 2015-06-17 DIAGNOSIS — E782 Mixed hyperlipidemia: Secondary | ICD-10-CM | POA: Diagnosis not present

## 2015-06-17 DIAGNOSIS — E119 Type 2 diabetes mellitus without complications: Secondary | ICD-10-CM | POA: Diagnosis not present

## 2015-06-20 DIAGNOSIS — R05 Cough: Secondary | ICD-10-CM | POA: Diagnosis not present

## 2015-06-20 DIAGNOSIS — R070 Pain in throat: Secondary | ICD-10-CM | POA: Diagnosis not present

## 2015-06-26 DIAGNOSIS — L609 Nail disorder, unspecified: Secondary | ICD-10-CM | POA: Diagnosis not present

## 2015-06-26 DIAGNOSIS — B351 Tinea unguium: Secondary | ICD-10-CM | POA: Diagnosis not present

## 2015-06-26 DIAGNOSIS — E1342 Other specified diabetes mellitus with diabetic polyneuropathy: Secondary | ICD-10-CM | POA: Diagnosis not present

## 2015-07-18 DIAGNOSIS — E119 Type 2 diabetes mellitus without complications: Secondary | ICD-10-CM | POA: Diagnosis not present

## 2015-07-18 DIAGNOSIS — E782 Mixed hyperlipidemia: Secondary | ICD-10-CM | POA: Diagnosis not present

## 2015-07-18 DIAGNOSIS — I10 Essential (primary) hypertension: Secondary | ICD-10-CM | POA: Diagnosis not present

## 2015-08-05 DIAGNOSIS — F639 Impulse disorder, unspecified: Secondary | ICD-10-CM | POA: Diagnosis not present

## 2015-09-02 DIAGNOSIS — F639 Impulse disorder, unspecified: Secondary | ICD-10-CM | POA: Diagnosis not present

## 2015-09-30 DIAGNOSIS — Z23 Encounter for immunization: Secondary | ICD-10-CM | POA: Diagnosis not present

## 2015-10-01 DIAGNOSIS — E1142 Type 2 diabetes mellitus with diabetic polyneuropathy: Secondary | ICD-10-CM | POA: Diagnosis not present

## 2015-10-01 DIAGNOSIS — L851 Acquired keratosis [keratoderma] palmaris et plantaris: Secondary | ICD-10-CM | POA: Diagnosis not present

## 2015-10-01 DIAGNOSIS — B351 Tinea unguium: Secondary | ICD-10-CM | POA: Diagnosis not present

## 2015-10-09 DIAGNOSIS — F919 Conduct disorder, unspecified: Secondary | ICD-10-CM | POA: Diagnosis not present

## 2015-10-09 DIAGNOSIS — F428 Other obsessive-compulsive disorder: Secondary | ICD-10-CM | POA: Diagnosis not present

## 2015-10-09 DIAGNOSIS — F209 Schizophrenia, unspecified: Secondary | ICD-10-CM | POA: Diagnosis not present

## 2015-10-09 DIAGNOSIS — F71 Moderate intellectual disabilities: Secondary | ICD-10-CM | POA: Diagnosis not present

## 2015-10-16 DIAGNOSIS — J00 Acute nasopharyngitis [common cold]: Secondary | ICD-10-CM | POA: Diagnosis not present

## 2015-11-18 DIAGNOSIS — F639 Impulse disorder, unspecified: Secondary | ICD-10-CM | POA: Diagnosis not present

## 2015-11-28 DIAGNOSIS — N3281 Overactive bladder: Secondary | ICD-10-CM | POA: Diagnosis not present

## 2015-12-27 DIAGNOSIS — E782 Mixed hyperlipidemia: Secondary | ICD-10-CM | POA: Diagnosis not present

## 2015-12-27 DIAGNOSIS — E1165 Type 2 diabetes mellitus with hyperglycemia: Secondary | ICD-10-CM | POA: Diagnosis not present

## 2016-01-03 DIAGNOSIS — I10 Essential (primary) hypertension: Secondary | ICD-10-CM | POA: Diagnosis not present

## 2016-01-03 DIAGNOSIS — F209 Schizophrenia, unspecified: Secondary | ICD-10-CM | POA: Diagnosis not present

## 2016-01-03 DIAGNOSIS — E119 Type 2 diabetes mellitus without complications: Secondary | ICD-10-CM | POA: Diagnosis not present

## 2016-01-03 DIAGNOSIS — E782 Mixed hyperlipidemia: Secondary | ICD-10-CM | POA: Diagnosis not present

## 2016-01-14 DIAGNOSIS — B351 Tinea unguium: Secondary | ICD-10-CM | POA: Diagnosis not present

## 2016-01-14 DIAGNOSIS — L851 Acquired keratosis [keratoderma] palmaris et plantaris: Secondary | ICD-10-CM | POA: Diagnosis not present

## 2016-01-14 DIAGNOSIS — E1142 Type 2 diabetes mellitus with diabetic polyneuropathy: Secondary | ICD-10-CM | POA: Diagnosis not present

## 2016-02-18 DIAGNOSIS — F70 Mild intellectual disabilities: Secondary | ICD-10-CM | POA: Diagnosis not present

## 2016-03-24 DIAGNOSIS — J028 Acute pharyngitis due to other specified organisms: Secondary | ICD-10-CM | POA: Diagnosis not present

## 2016-03-24 DIAGNOSIS — E119 Type 2 diabetes mellitus without complications: Secondary | ICD-10-CM | POA: Diagnosis not present

## 2016-04-24 DIAGNOSIS — E1142 Type 2 diabetes mellitus with diabetic polyneuropathy: Secondary | ICD-10-CM | POA: Diagnosis not present

## 2016-04-24 DIAGNOSIS — B351 Tinea unguium: Secondary | ICD-10-CM | POA: Diagnosis not present

## 2016-04-24 DIAGNOSIS — L851 Acquired keratosis [keratoderma] palmaris et plantaris: Secondary | ICD-10-CM | POA: Diagnosis not present

## 2016-05-11 DIAGNOSIS — F70 Mild intellectual disabilities: Secondary | ICD-10-CM | POA: Diagnosis not present

## 2016-07-03 DIAGNOSIS — L851 Acquired keratosis [keratoderma] palmaris et plantaris: Secondary | ICD-10-CM | POA: Diagnosis not present

## 2016-07-03 DIAGNOSIS — B351 Tinea unguium: Secondary | ICD-10-CM | POA: Diagnosis not present

## 2016-07-03 DIAGNOSIS — E1142 Type 2 diabetes mellitus with diabetic polyneuropathy: Secondary | ICD-10-CM | POA: Diagnosis not present

## 2016-07-06 DIAGNOSIS — F70 Mild intellectual disabilities: Secondary | ICD-10-CM | POA: Diagnosis not present

## 2016-07-09 DIAGNOSIS — E782 Mixed hyperlipidemia: Secondary | ICD-10-CM | POA: Diagnosis not present

## 2016-07-09 DIAGNOSIS — E119 Type 2 diabetes mellitus without complications: Secondary | ICD-10-CM | POA: Diagnosis not present

## 2016-07-13 DIAGNOSIS — F209 Schizophrenia, unspecified: Secondary | ICD-10-CM | POA: Diagnosis not present

## 2016-07-13 DIAGNOSIS — G40909 Epilepsy, unspecified, not intractable, without status epilepticus: Secondary | ICD-10-CM | POA: Diagnosis not present

## 2016-07-13 DIAGNOSIS — I10 Essential (primary) hypertension: Secondary | ICD-10-CM | POA: Diagnosis not present

## 2016-07-13 DIAGNOSIS — E782 Mixed hyperlipidemia: Secondary | ICD-10-CM | POA: Diagnosis not present

## 2016-07-13 DIAGNOSIS — E119 Type 2 diabetes mellitus without complications: Secondary | ICD-10-CM | POA: Diagnosis not present

## 2016-07-13 DIAGNOSIS — K219 Gastro-esophageal reflux disease without esophagitis: Secondary | ICD-10-CM | POA: Diagnosis not present

## 2016-08-24 ENCOUNTER — Emergency Department
Admission: EM | Admit: 2016-08-24 | Discharge: 2016-08-25 | Disposition: A | Discharge status: Home / Self Care | Payer: Medicare Other | Attending: Emergency Medicine | Admitting: Emergency Medicine

## 2016-08-24 ENCOUNTER — Encounter: Payer: Self-pay | Admitting: Emergency Medicine

## 2016-08-24 DIAGNOSIS — F29 Unspecified psychosis not due to a substance or known physiological condition: Secondary | ICD-10-CM | POA: Insufficient documentation

## 2016-08-24 DIAGNOSIS — F79 Unspecified intellectual disabilities: Secondary | ICD-10-CM | POA: Diagnosis not present

## 2016-08-24 DIAGNOSIS — R451 Restlessness and agitation: Secondary | ICD-10-CM | POA: Diagnosis not present

## 2016-08-24 DIAGNOSIS — F4325 Adjustment disorder with mixed disturbance of emotions and conduct: Secondary | ICD-10-CM | POA: Diagnosis not present

## 2016-08-24 DIAGNOSIS — R000 Tachycardia, unspecified: Secondary | ICD-10-CM | POA: Diagnosis not present

## 2016-08-24 DIAGNOSIS — Z7984 Long term (current) use of oral hypoglycemic drugs: Secondary | ICD-10-CM | POA: Insufficient documentation

## 2016-08-24 DIAGNOSIS — F70 Mild intellectual disabilities: Secondary | ICD-10-CM

## 2016-08-24 DIAGNOSIS — Z79899 Other long term (current) drug therapy: Secondary | ICD-10-CM | POA: Diagnosis not present

## 2016-08-24 DIAGNOSIS — F209 Schizophrenia, unspecified: Secondary | ICD-10-CM

## 2016-08-24 DIAGNOSIS — F4320 Adjustment disorder, unspecified: Secondary | ICD-10-CM

## 2016-08-24 DIAGNOSIS — R440 Auditory hallucinations: Secondary | ICD-10-CM | POA: Diagnosis present

## 2016-08-24 HISTORY — DX: Anxiety disorder, unspecified: F41.9

## 2016-08-24 LAB — URINE DRUG SCREEN, QUALITATIVE (ARMC ONLY)
AMPHETAMINES, UR SCREEN: POSITIVE — AB
BENZODIAZEPINE, UR SCRN: NOT DETECTED
Barbiturates, Ur Screen: NOT DETECTED
Cannabinoid 50 Ng, Ur ~~LOC~~: NOT DETECTED
Cocaine Metabolite,Ur ~~LOC~~: NOT DETECTED
MDMA (ECSTASY) UR SCREEN: NOT DETECTED
Methadone Scn, Ur: NOT DETECTED
Opiate, Ur Screen: NOT DETECTED
PHENCYCLIDINE (PCP) UR S: NOT DETECTED
TRICYCLIC, UR SCREEN: NOT DETECTED

## 2016-08-24 LAB — CARBAMAZEPINE LEVEL, TOTAL: Carbamazepine Lvl: 5.5 ug/mL (ref 4.0–12.0)

## 2016-08-24 LAB — COMPREHENSIVE METABOLIC PANEL
ALK PHOS: 93 U/L (ref 38–126)
ALT: 17 U/L (ref 17–63)
ANION GAP: 9 (ref 5–15)
AST: 28 U/L (ref 15–41)
Albumin: 4.6 g/dL (ref 3.5–5.0)
BILIRUBIN TOTAL: 0.4 mg/dL (ref 0.3–1.2)
BUN: 14 mg/dL (ref 6–20)
CALCIUM: 9.1 mg/dL (ref 8.9–10.3)
CO2: 27 mmol/L (ref 22–32)
CREATININE: 1 mg/dL (ref 0.61–1.24)
Chloride: 95 mmol/L — ABNORMAL LOW (ref 101–111)
Glucose, Bld: 166 mg/dL — ABNORMAL HIGH (ref 65–99)
Potassium: 4.8 mmol/L (ref 3.5–5.1)
SODIUM: 131 mmol/L — AB (ref 135–145)
TOTAL PROTEIN: 6.9 g/dL (ref 6.5–8.1)

## 2016-08-24 LAB — ETHANOL

## 2016-08-24 LAB — CBC
HCT: 44.8 % (ref 40.0–52.0)
Hemoglobin: 15.7 g/dL (ref 13.0–18.0)
MCH: 32.9 pg (ref 26.0–34.0)
MCHC: 35.1 g/dL (ref 32.0–36.0)
MCV: 93.7 fL (ref 80.0–100.0)
PLATELETS: 249 10*3/uL (ref 150–440)
RBC: 4.79 MIL/uL (ref 4.40–5.90)
RDW: 13.3 % (ref 11.5–14.5)
WBC: 5.7 10*3/uL (ref 3.8–10.6)

## 2016-08-24 LAB — ACETAMINOPHEN LEVEL

## 2016-08-24 LAB — SALICYLATE LEVEL

## 2016-08-24 NOTE — ED Provider Notes (Signed)
[facility] Emergency Department Provider Note   ____________________________________________   First MD Initiated Contact with Patient 08/24/16 1952     (approximate)  I have reviewed the triage vital signs and the nursing notes.   HISTORY  Chief Complaint Agitation (upset with group home) and Anxiety   HPI Jeremy Welch is a 55 y.o. male with a history of anxiety who is presenting to the emergency department tonight after running away from his group home. He says that he refused his medications none doesn't want me and his group home. He then ran away from his group home. He also reports auditory hallucinations and being unable to sleep because he hears these voices at night.He says the voices say "leave me alone." Patient says that he feels anxious at this time. Does not report any intentional self-harm. Does not report any suicidal or homicidal ideation.   Past Medical History:  Diagnosis Date  . Anxiety     There are no active problems to display for this patient.   History reviewed. No pertinent surgical history.  Prior to Admission medications   Not on File    Allergies Review of patient's allergies indicates no known allergies.  History reviewed. No pertinent family history.  Social History Social History  Substance Use Topics  . Smoking status: Unknown If Ever Smoked  . Smokeless tobacco: Not on file  . Alcohol use No    Review of Systems Constitutional: No fever/chills Eyes: No visual changes. ENT: No sore throat. Cardiovascular: Denies chest pain. Respiratory: Denies shortness of breath. Gastrointestinal: No abdominal pain.  No nausea, no vomiting.  No diarrhea.  No constipation. Genitourinary: Negative for dysuria. Musculoskeletal: Negative for back pain. Skin: Negative for rash. Neurological: Negative for headaches, focal weakness or numbness.  10-point ROS otherwise  negative.  ____________________________________________   PHYSICAL EXAM:  VITAL SIGNS: ED Triage Vitals  Enc Vitals Group     BP 08/24/16 1928 (!) 134/97     Pulse Rate 08/24/16 1928 (!) 116     Resp 08/24/16 1928 20     Temp 08/24/16 1928 97.7 F (36.5 C)     Temp Source 08/24/16 1928 Oral     SpO2 08/24/16 1928 95 %     Weight 08/24/16 1929 155 lb (70.3 kg)     Height 08/24/16 1929 5\' 8"  (1.727 m)     Head Circumference --      Peak Flow --      Pain Score --      Pain Loc --      Pain Edu? --      Excl. in GC? --     Constitutional: Alert and oriented. Well appearing and in no acute distress. Eyes: Conjunctivae are normal. PERRL. EOMI. Head: Atraumatic. Nose: No congestion/rhinnorhea. Mouth/Throat: Mucous membranes are moist.  Neck: No stridor.   Cardiovascular: Tachycardic, regular rhythm. Grossly normal heart sounds.  Respiratory: Normal respiratory effort.  No retractions. Lungs CTAB. Gastrointestinal: Soft and nontender. No distention.  Musculoskeletal: No lower extremity tenderness nor edema.  No joint effusions. Neurologic:  Normal speech and language. No gross focal neurologic deficits are appreciated. Skin:  Skin is warm, dry and intact. No rash noted. Psychiatric: Odd affect. Does not appear anxious but says that he feels this way.  ____________________________________________   LABS (all labs ordered are listed, but only abnormal results are displayed)  Labs Reviewed  CBC  COMPREHENSIVE METABOLIC PANEL  ETHANOL  URINE DRUG SCREEN, QUALITATIVE ([facility] ONLY)  ACETAMINOPHEN LEVEL  SALICYLATE LEVEL   ____________________________________________  EKG   ____________________________________________  RADIOLOGY   ____________________________________________   PROCEDURES  Procedure(s) performed:   Procedures  Critical Care performed:   ____________________________________________   INITIAL IMPRESSION / ASSESSMENT AND PLAN / ED  COURSE  Pertinent labs & imaging results that were available during my care of the patient were reviewed by me and considered in my medical decision making (see chart for details).  ----------------------------------------- 8:04 PM on 08/24/2016 -----------------------------------------  Patient with auditory hallucinations. Refusing medications. Ran away from group home. I feel that he is a risk to his own safety. I will be committing him involuntarily. He is aware of this and also aware that the need to be evaluated by the psychiatrist. He is tachycardic but says that he feels anxious. He denies any pain or shortness of breath. We'll continue to observe and reassess vital signs.  Clinical Course     ____________________________________________   FINAL CLINICAL IMPRESSION(S) / ED DIAGNOSES  Psychosis.    NEW MEDICATIONS STARTED DURING THIS VISIT:  New Prescriptions   No medications on file     Note:  This document was prepared using Dragon voice recognition software and may include unintentional dictation errors.    Myrna Blazer, MD 08/24/16 2005

## 2016-08-24 NOTE — ED Triage Notes (Signed)
Pt presents to ED with [place] police after he left group home. Per [place] police , [place] was [place] [address]. Per pt, he states he refused medication and left afterwards. Voluntary on arrival.

## 2016-08-24 NOTE — ED Notes (Signed)
Family at bedside. 

## 2016-08-24 NOTE — ED Notes (Signed)
Pt reports during initial evaluation that he has problems with one staff member, Benson NorwayLamar. Pt states Benson NorwayLamar makes him go to bed at 1930 when he works. Pt states no other staff member does this. Pt states he refused to take his meds tonight because Benson NorwayLamar makes him go to bed too early. Pt denies SI/HI and denies wanting to harm Benson NorwayLamar. Pt states he was out of control tonight. Pt states he knows that his medications help him not hear voices. Pt states the voices tell him to hurt himself. Pt acknowledges that not taking his meds did not help him to avoid hearing the voices. Pt has been calm, appropriate, reasonable and cooperative. Pt states he will take his meds for this RN when they are presented.

## 2016-08-24 NOTE — ED Notes (Signed)
Pt was given a dinner tray upon arrival to room with RN approval. Pt seen lying in bed with eyes closed. Pt was offered his midnight snack tray but pt refused verbalizing "Im full I just want something to drink"./ Pt was given a beverage. Nothing needed by staff at this time

## 2016-08-24 NOTE — BH Assessment (Signed)
Assessment Note  Jeremy Welch is an 55 y.o. male, with a history of anxiety,presenting to the ED via [employer].  Patient was picked up tonight  after running away from his group home. He says that he refused his medications because the staff was was getting on "his last nerves."  Patients reports auditory hallucinations and being unable to sleep when he doesn't take his meds.He says the voices say "leave me alone." Patient reports also being upset because group home staff were training to make him go to bed at 7:30 pm.  Pt denies any suicidal or homicidal ideations.  Diagnosis: Anxiety  Past Medical History:  Past Medical History:  Diagnosis Date  . Anxiety     History reviewed. No pertinent surgical history.  Family History: History reviewed. No pertinent family history.  Social History:  reports that he does not drink alcohol. His tobacco and drug histories are not on file.  Additional Social History:  Alcohol / Drug Use History of alcohol / drug use?: No history of alcohol / drug abuse  CIWA: CIWA-Ar BP: (!) 134/97 Pulse Rate: (!) 116 COWS:    Allergies: No Known Allergies  Home Medications:  (Not in a hospital admission)  OB/GYN Status:  No LMP for male patient.  General Assessment Data Location of Assessment: [facility] ED TTS Assessment: In system Is this a Tele or Face-to-Face Assessment?: Face-to-Face Is this an Initial Assessment or a Re-assessment for this encounter?: Initial Assessment Marital status: Single Maiden name: n/a Is patient pregnant?: No Pregnancy Status: No Living Arrangements: Group Home ([facility]) Can pt return to current living arrangement?: Yes Admission Status: Voluntary Is patient capable of signing voluntary admission?: No Referral Source: Other Insurance type: Medicare  Medical Screening Exam [facility] Walk-in ONLY) Medical Exam completed: Yes  Crisis Care Plan Living Arrangements: Group Home ([facility]) Legal Guardian: Other:  Sherrilyn Rist [phone]) Name of Psychiatrist: Dr. Janeece Riggers Name of Therapist: Dr. Janeece Riggers  Education Status Is patient currently in school?: No Current Grade: n/a Highest grade of school patient has completed: n/a Name of school: n/a Contact person: n/a  Risk to self with the past 6 months Suicidal Ideation: No Has patient been a risk to self within the past 6 months prior to admission? : No Suicidal Intent: No Has patient had any suicidal intent within the past 6 months prior to admission? : No Is patient at risk for suicide?: No Suicidal Plan?: No Has patient had any suicidal plan within the past 6 months prior to admission? : No Access to Means: No What has been your use of drugs/alcohol within the last 12 months?: None identified Previous Attempts/Gestures: No How many times?: 0 Other Self Harm Risks: None identified Triggers for Past Attempts: None known Intentional Self Injurious Behavior: None Family Suicide History: No Recent stressful life event(s): Conflict (Comment) (conflict with staff member at group home) Persecutory voices/beliefs?: No Depression: No Substance abuse history and/or treatment for substance abuse?: No Suicide prevention information given to non-admitted patients: Not applicable  Risk to Others within the past 6 months Homicidal Ideation: No Does patient have any lifetime risk of violence toward others beyond the six months prior to admission? : No Thoughts of Harm to Others: No Current Homicidal Intent: No Current Homicidal Plan: No Access to Homicidal Means: No Identified Victim: None identified History of harm to others?: No Assessment of Violence: None Noted Violent Behavior Description: None identified Does patient have access to weapons?: No Criminal Charges Pending?: No Does patient have  a court date: No Is patient on probation?: No  Psychosis Hallucinations: None noted Delusions: None noted  Mental Status Report Appearance/Hygiene:  In scrubs Eye Contact: Good Motor Activity: Freedom of movement, Unremarkable Speech: Logical/coherent Level of Consciousness: Alert Mood: Pleasant Affect: Appropriate to circumstance Anxiety Level: Minimal Thought Processes: Relevant Judgement: Impaired Orientation: Person, Place, Time, Situation Obsessive Compulsive Thoughts/Behaviors: None  Cognitive Functioning Concentration: Good Memory: Recent Intact, Remote Intact IQ: Average Insight: Fair Impulse Control: Fair Appetite: Good Weight Loss: 0 Weight Gain: 0 Sleep: No Change Vegetative Symptoms: None  ADLScreening [facility]) Patient's cognitive ability adequate to safely complete daily activities?: Yes Patient able to express need for assistance with ADLs?: Yes Independently performs ADLs?: Yes (appropriate for developmental age)  Prior Inpatient Therapy Prior Inpatient Therapy: No Prior Therapy Dates: n/a Prior Therapy Facilty/Provider(s): n/a Reason for Treatment: n/a  Prior Outpatient Therapy Prior Outpatient Therapy: Yes Prior Therapy Dates: current Prior Therapy Facilty/Provider(s): n/a Reason for Treatment: n/a Does patient have an ACCT team?: No Does patient have Intensive In-House Services?  : No Does patient have [facility] services? : No Does patient have P4CC services?: No  ADL Screening (condition at time of admission) Patient's cognitive ability adequate to safely complete daily activities?: Yes Patient able to express need for assistance with ADLs?: Yes Independently performs ADLs?: Yes (appropriate for developmental age)       Abuse/Neglect Assessment (Assessment to be complete while patient is alone) Physical Abuse: Denies Verbal Abuse: Denies Sexual Abuse: Denies Exploitation of patient/patient's resources: Denies Self-Neglect: Denies Values / Beliefs Cultural Requests During Hospitalization: None Spiritual Requests During Hospitalization: None Consults Spiritual Care  Consult Needed: No Social Work Consult Needed: No      Additional Information 1:1 In Past 12 Months?: No CIRT Risk: No Elopement Risk: No Does patient have medical clearance?: Yes     Disposition:  Disposition Initial Assessment Completed for this Encounter: Yes Disposition of Patient: Other dispositions Other disposition(s): Other (Comment) (Pending Psych MD consult)  On Site Evaluation by:   Reviewed with Physician:    Damico Partin C Laurina Fischl 08/24/2016 9:22 PM

## 2016-08-25 DIAGNOSIS — F4320 Adjustment disorder, unspecified: Secondary | ICD-10-CM

## 2016-08-25 DIAGNOSIS — F70 Mild intellectual disabilities: Secondary | ICD-10-CM

## 2016-08-25 DIAGNOSIS — F4325 Adjustment disorder with mixed disturbance of emotions and conduct: Secondary | ICD-10-CM | POA: Diagnosis not present

## 2016-08-25 DIAGNOSIS — F209 Schizophrenia, unspecified: Secondary | ICD-10-CM

## 2016-08-25 DIAGNOSIS — F29 Unspecified psychosis not due to a substance or known physiological condition: Secondary | ICD-10-CM | POA: Diagnosis not present

## 2016-08-25 MED ORDER — LAMOTRIGINE 100 MG PO TABS
200.0000 mg | ORAL_TABLET | Freq: Every day | ORAL | Status: DC
  Administered 2016-08-25: 200 mg via ORAL
  Filled 2016-08-25: qty 2

## 2016-08-25 MED ORDER — PANTOPRAZOLE SODIUM 40 MG PO TBEC
40.0000 mg | DELAYED_RELEASE_TABLET | Freq: Every day | ORAL | Status: DC
Start: 2016-08-25 — End: 2016-08-25
  Administered 2016-08-25: 40 mg via ORAL
  Filled 2016-08-25: qty 1

## 2016-08-25 MED ORDER — LORAZEPAM 2 MG PO TABS
ORAL_TABLET | ORAL | Status: AC
  Filled 2016-08-25: qty 1

## 2016-08-25 MED ORDER — METOPROLOL TARTRATE 25 MG PO TABS
25.0000 mg | ORAL_TABLET | Freq: Two times a day (BID) | ORAL | Status: DC
  Administered 2016-08-25: 25 mg via ORAL
  Filled 2016-08-25 (×2): qty 1

## 2016-08-25 MED ORDER — LOSARTAN POTASSIUM 50 MG PO TABS
50.0000 mg | ORAL_TABLET | Freq: Every day | ORAL | Status: DC
  Administered 2016-08-25: 50 mg via ORAL
  Filled 2016-08-25: qty 1

## 2016-08-25 MED ORDER — ARIPIPRAZOLE 5 MG PO TABS
30.0000 mg | ORAL_TABLET | Freq: Every day | ORAL | Status: DC
  Administered 2016-08-25 (×2): 30 mg via ORAL
  Filled 2016-08-25 (×2): qty 6

## 2016-08-25 MED ORDER — CARBAMAZEPINE 200 MG PO TABS
200.0000 mg | ORAL_TABLET | Freq: Two times a day (BID) | ORAL | Status: DC
  Administered 2016-08-25 (×2): 200 mg via ORAL
  Filled 2016-08-25 (×2): qty 1

## 2016-08-25 MED ORDER — TRAZODONE HCL 100 MG PO TABS
100.0000 mg | ORAL_TABLET | Freq: Every day | ORAL | Status: DC
  Administered 2016-08-25: 100 mg via ORAL
  Filled 2016-08-25: qty 1

## 2016-08-25 MED ORDER — OXYBUTYNIN CHLORIDE ER 10 MG PO TB24
10.0000 mg | ORAL_TABLET | Freq: Every day | ORAL | Status: DC
  Administered 2016-08-25: 10 mg via ORAL
  Filled 2016-08-25: qty 1

## 2016-08-25 MED ORDER — METFORMIN HCL 500 MG PO TABS
500.0000 mg | ORAL_TABLET | ORAL | Status: DC
  Administered 2016-08-25: 500 mg via ORAL
  Filled 2016-08-25: qty 1

## 2016-08-25 MED ORDER — AMPHETAMINE-DEXTROAMPHET ER 5 MG PO CP24
10.0000 mg | ORAL_CAPSULE | Freq: Two times a day (BID) | ORAL | Status: DC
  Administered 2016-08-25 (×2): 10 mg via ORAL
  Filled 2016-08-25 (×2): qty 2

## 2016-08-25 MED ORDER — LORAZEPAM 2 MG PO TABS
2.0000 mg | ORAL_TABLET | Freq: Once | ORAL | Status: AC
  Administered 2016-08-25: 2 mg via ORAL
  Filled 2016-08-25: qty 1

## 2016-08-25 MED ORDER — ATORVASTATIN CALCIUM 20 MG PO TABS
20.0000 mg | ORAL_TABLET | Freq: Every evening | ORAL | Status: DC
  Administered 2016-08-25: 20 mg via ORAL
  Filled 2016-08-25: qty 1

## 2016-08-25 MED ORDER — DOCUSATE SODIUM 100 MG PO CAPS
100.0000 mg | ORAL_CAPSULE | Freq: Two times a day (BID) | ORAL | Status: DC
  Administered 2016-08-25 (×2): 100 mg via ORAL
  Filled 2016-08-25 (×2): qty 1

## 2016-08-25 NOTE — ED Notes (Signed)
Called doneesha at group home. She will come get pt in 1 hour. Will go over DC papers when arrives.

## 2016-08-25 NOTE — ED Notes (Signed)
Dr Mayford Knifewilliams notified pt hearing voices

## 2016-08-25 NOTE — ED Notes (Signed)
Patient in room eating crackers and peanut butter

## 2016-08-25 NOTE — Consult Note (Signed)
[facility] Face-to-Face Psychiatry Consult   Reason for Consult:  Consult for 55 year old man with a history of schizophrenia and mental retardation. Brought in because he was acting out at his group home. Referring Physician:  Joni Fears Patient Identification: Jeremy Welch MRN:  [id number] Principal Diagnosis: Adjustment disorder Diagnosis:   Patient Active Problem List   Diagnosis Date Noted  . 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 Schizophrenia ([place]) [F20.9] 08/25/2016  . Adjustment disorder [F43.20] 08/25/2016    Total Time spent with patient: 1 hour  Subjective:   Jeremy Welch is a 55 y.o. male patient admitted with "my staff was just getting worse".  HPI:  This is a 55 year old man with a history of intellectual disability and schizophrenia. Apparently was getting worked up and agitated at his group home. Reportedly had run away from the group home a couple times today and had to be brought back. Patient is not a very good historian. He tells me that he has been hearing a voice at that is getting worse but he can't actually explain what the voices like. When he tries to describe that he mostly is describing having a headache. Doesn't actually seemed to be describing a hallucination. Patient's history is not very consistent. Doesn't really describe any specific mood problem. He does mention however that one of the staff members at the group home has been getting on his nerves and stressing him out. He says he hasn't been sleeping all that well. Patient completely denies any thoughts about wanting to die or wanting to hurt anyone else. He says he is feeling better since coming here to the emergency room today. Denies any changes to his medicine. Denies any substance abuse problems.  Social history: Lives in a group home. Appears to have a legal guardian.  Medical history: Patient doesn't know of any medical problems outside of the psychiatric.  Substance abuse history: Denies any  alcohol or drug abuse history currently or past.  Past Psychiatric History: Patient has long-standing mental health problems with a diagnosis of schizophrenia and chronic intellectual disability. He denies ever having tried to kill himself or being violent in the past. He says he is compliant with all of his medicines. Unclear whether he is ever had a psychiatric hospitalization. He thinks that he has but that it has been years ago.  Risk to Self: Suicidal Ideation: No Suicidal Intent: No Is patient at risk for suicide?: No Suicidal Plan?: No Access to Means: No What has been your use of drugs/alcohol within the last 12 months?: None identified How many times?: 0 Other Self Harm Risks: None identified Triggers for Past Attempts: None known Intentional Self Injurious Behavior: None Risk to Others: Homicidal Ideation: No Thoughts of Harm to Others: No Current Homicidal Intent: No Current Homicidal Plan: No Access to Homicidal Means: No Identified Victim: None identified History of harm to others?: No Assessment of Violence: None Noted Violent Behavior Description: None identified Does patient have access to weapons?: No Criminal Charges Pending?: No Does patient have a court date: No Prior Inpatient Therapy: Prior Inpatient Therapy: No Prior Therapy Dates: n/a Prior Therapy Facilty/Provider(s): n/a Reason for Treatment: n/a Prior Outpatient Therapy: Prior Outpatient Therapy: Yes Prior Therapy Dates: current Prior Therapy Facilty/Provider(s): n/a Reason for Treatment: n/a Does patient have an ACCT team?: No Does patient have Intensive In-House Services?  : No Does patient have [facility] services? : No Does patient have [facility] services?: No  Past Medical History:  Past Medical History:  Diagnosis  Date  . Anxiety    History reviewed. No pertinent surgical history. Family History: History reviewed. No pertinent family history. Family Psychiatric  History: Patient is not aware of  any family history of mental illness Social History:  History  Alcohol Use No     History  Drug use: Unknown    Social History   Social History  . Marital status: Single    Spouse name: N/A  . Number of children: N/A  . Years of education: N/A   Social History Main Topics  . Smoking status: Unknown If Ever Smoked  . Smokeless tobacco: None  . Alcohol use No  . Drug use: Unknown  . Sexual activity: No   Other Topics Concern  . None   Social History Narrative  . None   Additional Social History:    Allergies:  No Known Allergies  Labs:  Results for orders placed or performed during the hospital encounter of 08/24/16 (from the past 48 hour(s))  Comprehensive metabolic panel     Status: Abnormal   Collection Time: 08/24/16  7:30 PM  Result Value Ref Range   Sodium 131 (L) 135 - 145 mmol/L   Potassium 4.8 3.5 - 5.1 mmol/L   Chloride 95 (L) 101 - 111 mmol/L   CO2 27 22 - 32 mmol/L   Glucose, Bld 166 (H) 65 - 99 mg/dL   BUN 14 6 - 20 mg/dL   Creatinine, Ser 1.00 0.61 - 1.24 mg/dL   Calcium 9.1 8.9 - 10.3 mg/dL   Total Protein 6.9 6.5 - 8.1 g/dL   Albumin 4.6 3.5 - 5.0 g/dL   AST 28 15 - 41 U/L   ALT 17 17 - 63 U/L   Alkaline Phosphatase 93 38 - 126 U/L   Total Bilirubin 0.4 0.3 - 1.2 mg/dL   GFR calc non Af Amer >60 >60 mL/min   GFR calc Af Amer >60 >60 mL/min    Comment: (NOTE) The eGFR has been calculated using the CKD EPI equation. This calculation has not been validated in all clinical situations. eGFR's persistently <60 mL/min signify possible Chronic Kidney Disease.    Anion gap 9 5 - 15  Ethanol     Status: None   Collection Time: 08/24/16  7:30 PM  Result Value Ref Range   Alcohol, Ethyl (B) <5 <5 mg/dL    Comment:        LOWEST DETECTABLE LIMIT FOR SERUM ALCOHOL IS 5 mg/dL FOR MEDICAL PURPOSES ONLY   cbc     Status: None   Collection Time: 08/24/16  7:30 PM  Result Value Ref Range   WBC 5.7 3.8 - 10.6 K/uL   RBC 4.79 4.40 - 5.90 MIL/uL    Hemoglobin 15.7 13.0 - 18.0 g/dL   HCT 44.8 40.0 - 52.0 %   MCV 93.7 80.0 - 100.0 fL   MCH 32.9 26.0 - 34.0 pg   MCHC 35.1 32.0 - 36.0 g/dL   RDW 13.3 11.5 - 14.5 %   Platelets 249 150 - 440 K/uL  Acetaminophen level     Status: Abnormal   Collection Time: 08/24/16  7:30 PM  Result Value Ref Range   Acetaminophen (Tylenol), Serum <10 (L) 10 - 30 ug/mL    Comment:        THERAPEUTIC CONCENTRATIONS VARY SIGNIFICANTLY. A RANGE OF 10-30 ug/mL MAY BE AN EFFECTIVE CONCENTRATION FOR MANY PATIENTS. HOWEVER, SOME ARE BEST TREATED AT CONCENTRATIONS OUTSIDE THIS RANGE. ACETAMINOPHEN CONCENTRATIONS >150 ug/mL AT 4 HOURS  AFTER INGESTION AND >50 ug/mL AT 12 HOURS AFTER INGESTION ARE OFTEN ASSOCIATED WITH TOXIC REACTIONS.   Salicylate level     Status: None   Collection Time: 08/24/16  7:30 PM  Result Value Ref Range   Salicylate Lvl <6.7 2.8 - 30.0 mg/dL  Urine Drug Screen, Qualitative     Status: Abnormal   Collection Time: 08/24/16  8:26 PM  Result Value Ref Range   Tricyclic, Ur Screen NONE DETECTED NONE DETECTED   Amphetamines, Ur Screen POSITIVE (A) NONE DETECTED   MDMA (Ecstasy)Ur Screen NONE DETECTED NONE DETECTED   Cocaine Metabolite,Ur [place] NONE DETECTED NONE DETECTED   Opiate, Ur Screen NONE DETECTED NONE DETECTED   Phencyclidine (PCP) Ur S NONE DETECTED NONE DETECTED   Cannabinoid 50 Ng, Ur [place] NONE DETECTED NONE DETECTED   Barbiturates, Ur Screen NONE DETECTED NONE DETECTED   Benzodiazepine, Ur Scrn NONE DETECTED NONE DETECTED   Methadone Scn, Ur NONE DETECTED NONE DETECTED    Comment: (NOTE) 893  Tricyclics, urine               Cutoff 1000 ng/mL 200  Amphetamines, urine             Cutoff 1000 ng/mL 300  MDMA (Ecstasy), urine           Cutoff 500 ng/mL 400  Cocaine Metabolite, urine       Cutoff 300 ng/mL 500  Opiate, urine                   Cutoff 300 ng/mL 600  Phencyclidine (PCP), urine      Cutoff 25 ng/mL 700  Cannabinoid, urine              Cutoff 50 ng/mL 800   Barbiturates, urine             Cutoff 200 ng/mL 900  Benzodiazepine, urine           Cutoff 200 ng/mL 1000 Methadone, urine                Cutoff 300 ng/mL 1100 1200 The urine drug screen provides only a preliminary, unconfirmed 1300 analytical test result and should not be used for non-medical 1400 purposes. Clinical consideration and professional judgment should 1500 be applied to any positive drug screen result due to possible 1600 interfering substances. A more specific alternate chemical method 1700 must be used in order to obtain a confirmed analytical result.  1800 Gas chromato graphy / mass spectrometry (GC/MS) is the preferred 1900 confirmatory method.   Carbamazepine level, total     Status: None   Collection Time: 08/24/16 10:30 PM  Result Value Ref Range   Carbamazepine Lvl 5.5 4.0 - 12.0 ug/mL    Current Facility-Administered Medications  Medication Dose Route Frequency Provider Last Rate Last Dose  . amphetamine-dextroamphetamine (ADDERALL XR) 24 hr capsule 10 mg  10 mg Oral BID Orbie Pyo, MD   10 mg at 08/25/16 1002  . ARIPiprazole (ABILIFY) tablet 30 mg  30 mg Oral Daily Orbie Pyo, MD   30 mg at 08/25/16 1002  . atorvastatin (LIPITOR) tablet 20 mg  20 mg Oral QPM Orbie Pyo, MD   20 mg at 08/25/16 1725  . carbamazepine (TEGRETOL) tablet 200 mg  200 mg Oral BID Orbie Pyo, MD   200 mg at 08/25/16 1002  . docusate sodium (COLACE) capsule 100 mg  100 mg Oral BID Orbie Pyo, MD   100 mg  at 08/25/16 1002  . lamoTRIgine (LAMICTAL) tablet 200 mg  200 mg Oral QHS Orbie Pyo, MD   200 mg at 08/25/16 0120  . losartan (COZAAR) tablet 50 mg  50 mg Oral Daily Orbie Pyo, MD   50 mg at 08/25/16 1002  . metFORMIN (GLUCOPHAGE) tablet 500 mg  500 mg Oral BH-q7a Orbie Pyo, MD   500 mg at 08/25/16 0650  . metoprolol tartrate (LOPRESSOR) tablet 25 mg  25 mg Oral BID Orbie Pyo, MD   25 mg at 08/25/16 0028  . oxybutynin (DITROPAN-XL) 24 hr tablet 10 mg  10 mg Oral Daily Orbie Pyo, MD   10 mg at 08/25/16 1002  . pantoprazole (PROTONIX) EC tablet 40 mg  40 mg Oral Daily Orbie Pyo, MD   40 mg at 08/25/16 1002  . traZODone (DESYREL) tablet 100 mg  100 mg Oral QHS Orbie Pyo, MD   100 mg at 08/25/16 0028   Current Outpatient Prescriptions  Medication Sig Dispense Refill  . amphetamine-dextroamphetamine (ADDERALL XR) 10 MG 24 hr capsule Take 10 mg by mouth 2 (two) times daily.    . ARIPiprazole (ABILIFY) 30 MG tablet Take 30 mg by mouth daily.    Marland Kitchen atorvastatin (LIPITOR) 40 MG tablet Take 20 mg by mouth every evening.    . carbamazepine (TEGRETOL) 200 MG tablet Take 200 mg by mouth 2 (two) times daily.    Marland Kitchen docusate sodium (COLACE) 100 MG capsule Take 100 mg by mouth 2 (two) times daily.    Marland Kitchen esomeprazole (NEXIUM) 40 MG capsule Take 40 mg by mouth daily at 12 noon.    . lamoTRIgine (LAMICTAL) 200 MG tablet Take 200 mg by mouth at bedtime.    Marland Kitchen losartan (COZAAR) 50 MG tablet Take 50 mg by mouth daily.    . metFORMIN (GLUCOPHAGE) 500 MG tablet Take 500 mg by mouth every morning.    . metoprolol (LOPRESSOR) 50 MG tablet Take 25 mg by mouth 2 (two) times daily.    Marland Kitchen oxybutynin (DITROPAN-XL) 10 MG 24 hr tablet Take 10 mg by mouth daily.    . traZODone (DESYREL) 100 MG tablet Take 100 mg by mouth at bedtime.      Musculoskeletal: Strength & Muscle Tone: within normal limits Gait & Station: normal Patient leans: N/A  Psychiatric Specialty Exam: Physical Exam  Nursing note and vitals reviewed. Constitutional: He appears well-developed and well-nourished.  HENT:  Head: Normocephalic and atraumatic.  Eyes: Conjunctivae are normal. Pupils are equal, round, and reactive to light.  Neck: Normal range of motion.  Cardiovascular: Regular rhythm and normal heart sounds.   Respiratory: Effort normal. No respiratory distress.   GI: Soft.  Musculoskeletal: Normal range of motion.  Neurological: He is alert.  Skin: Skin is warm and dry.  Psychiatric: His affect is blunt. His speech is delayed. He is slowed. He expresses impulsivity. He expresses no homicidal and no suicidal ideation. He exhibits abnormal recent memory and abnormal remote memory.    Review of Systems  Constitutional: Negative.   HENT: Negative.   Eyes: Negative.   Respiratory: Negative.   Cardiovascular: Negative.   Gastrointestinal: Negative.   Musculoskeletal: Negative.   Skin: Negative.   Neurological: Negative.   Psychiatric/Behavioral: Positive for hallucinations. Negative for depression, memory loss, substance abuse and suicidal ideas. The patient is not nervous/anxious and does not have insomnia.     Blood pressure (!) 155/100, pulse 87, temperature 97.8 F (36.6 C), temperature  source Oral, resp. rate 18, height 5' 8"  (1.727 m), weight 70.3 kg (155 lb), SpO2 100 %.Body mass index is 23.57 kg/m.  General Appearance: Disheveled  Eye Contact:  Fair  Speech:  Garbled and Slurred  Volume:  Decreased  Mood:  Euthymic  Affect:  Congruent  Thought Process:  Disorganized  Orientation:  Full (Time, Place, and Person)  Thought Content:  Illogical  Suicidal Thoughts:  No  Homicidal Thoughts:  No  Memory:  Immediate;   Fair Recent;   Fair Remote;   Fair  Judgement:  Impaired  Insight:  Lacking  Psychomotor Activity:  Decreased  Concentration:  Concentration: Fair  Recall:  Poor  Fund of Knowledge:  Poor  Language:  Poor  Akathisia:  No  Handed:  Right  AIMS (if indicated):     Assets:  Catering manager Housing Physical Health  ADL's:  Intact  Cognition:  Impaired,  Moderate  Sleep:        Treatment Plan Summary: Plan This is a 55 year old man with a history of schizophrenia and mild MR area currently calm without any aggression suicidal behavior or agitation. Not acting bizarrely. Denies any suicidal thoughts or  plans. Patient does not meet commitment criteria. Does not require inpatient treatment. Case reviewed with the ER physician and TTS staff. Patient can be discharged back to his group home and follow-up with his outpatient mental health provider.  Disposition: Patient does not meet criteria for psychiatric inpatient admission. Supportive therapy provided about ongoing stressors.  Alethia Berthold, MD 08/25/2016 6:27 PM

## 2016-08-25 NOTE — ED Notes (Signed)
Patient in bathroom at this time.

## 2016-08-25 NOTE — ED Notes (Signed)
Breakfast was given to patient. 

## 2016-08-27 DIAGNOSIS — F70 Mild intellectual disabilities: Secondary | ICD-10-CM | POA: Diagnosis not present

## 2016-09-03 DIAGNOSIS — F0390 Unspecified dementia without behavioral disturbance: Secondary | ICD-10-CM | POA: Diagnosis not present

## 2016-09-03 DIAGNOSIS — Z79899 Other long term (current) drug therapy: Secondary | ICD-10-CM | POA: Diagnosis not present

## 2016-09-07 DIAGNOSIS — F209 Schizophrenia, unspecified: Secondary | ICD-10-CM | POA: Diagnosis not present

## 2016-09-07 DIAGNOSIS — F09 Unspecified mental disorder due to known physiological condition: Secondary | ICD-10-CM | POA: Diagnosis not present

## 2016-09-18 DIAGNOSIS — R413 Other amnesia: Secondary | ICD-10-CM | POA: Diagnosis not present

## 2016-09-18 DIAGNOSIS — F0390 Unspecified dementia without behavioral disturbance: Secondary | ICD-10-CM | POA: Diagnosis not present

## 2016-09-23 DIAGNOSIS — F70 Mild intellectual disabilities: Secondary | ICD-10-CM | POA: Diagnosis not present

## 2016-10-09 DIAGNOSIS — B351 Tinea unguium: Secondary | ICD-10-CM | POA: Diagnosis not present

## 2016-10-09 DIAGNOSIS — L851 Acquired keratosis [keratoderma] palmaris et plantaris: Secondary | ICD-10-CM | POA: Diagnosis not present

## 2016-10-09 DIAGNOSIS — E1142 Type 2 diabetes mellitus with diabetic polyneuropathy: Secondary | ICD-10-CM | POA: Diagnosis not present

## 2016-10-14 DIAGNOSIS — Z23 Encounter for immunization: Secondary | ICD-10-CM | POA: Diagnosis not present

## 2016-12-08 DIAGNOSIS — F209 Schizophrenia, unspecified: Secondary | ICD-10-CM | POA: Diagnosis not present

## 2016-12-08 DIAGNOSIS — I10 Essential (primary) hypertension: Secondary | ICD-10-CM | POA: Diagnosis not present

## 2016-12-08 DIAGNOSIS — E782 Mixed hyperlipidemia: Secondary | ICD-10-CM | POA: Diagnosis not present

## 2016-12-08 DIAGNOSIS — E119 Type 2 diabetes mellitus without complications: Secondary | ICD-10-CM | POA: Diagnosis not present

## 2016-12-18 DIAGNOSIS — E1142 Type 2 diabetes mellitus with diabetic polyneuropathy: Secondary | ICD-10-CM | POA: Diagnosis not present

## 2016-12-18 DIAGNOSIS — B351 Tinea unguium: Secondary | ICD-10-CM | POA: Diagnosis not present

## 2016-12-18 DIAGNOSIS — L851 Acquired keratosis [keratoderma] palmaris et plantaris: Secondary | ICD-10-CM | POA: Diagnosis not present

## 2017-01-26 DIAGNOSIS — J060 Acute laryngopharyngitis: Secondary | ICD-10-CM | POA: Diagnosis not present

## 2017-01-26 DIAGNOSIS — R05 Cough: Secondary | ICD-10-CM | POA: Diagnosis not present

## 2017-01-26 DIAGNOSIS — R066 Hiccough: Secondary | ICD-10-CM | POA: Diagnosis not present

## 2017-02-03 DIAGNOSIS — R6884 Jaw pain: Secondary | ICD-10-CM | POA: Diagnosis not present

## 2017-02-03 DIAGNOSIS — K1121 Acute sialoadenitis: Secondary | ICD-10-CM | POA: Diagnosis not present

## 2017-02-04 DIAGNOSIS — K118 Other diseases of salivary glands: Secondary | ICD-10-CM | POA: Diagnosis not present

## 2017-02-04 DIAGNOSIS — R220 Localized swelling, mass and lump, head: Secondary | ICD-10-CM | POA: Diagnosis not present

## 2017-03-12 DIAGNOSIS — L851 Acquired keratosis [keratoderma] palmaris et plantaris: Secondary | ICD-10-CM | POA: Diagnosis not present

## 2017-03-12 DIAGNOSIS — E1142 Type 2 diabetes mellitus with diabetic polyneuropathy: Secondary | ICD-10-CM | POA: Diagnosis not present

## 2017-03-12 DIAGNOSIS — B351 Tinea unguium: Secondary | ICD-10-CM | POA: Diagnosis not present

## 2017-04-02 DIAGNOSIS — Z6822 Body mass index (BMI) 22.0-22.9, adult: Secondary | ICD-10-CM | POA: Diagnosis not present

## 2017-04-02 DIAGNOSIS — L03011 Cellulitis of right finger: Secondary | ICD-10-CM | POA: Diagnosis not present

## 2017-04-02 DIAGNOSIS — E782 Mixed hyperlipidemia: Secondary | ICD-10-CM | POA: Diagnosis not present

## 2017-05-06 DIAGNOSIS — H40053 Ocular hypertension, bilateral: Secondary | ICD-10-CM | POA: Diagnosis not present

## 2017-05-28 DIAGNOSIS — B351 Tinea unguium: Secondary | ICD-10-CM | POA: Diagnosis not present

## 2017-05-28 DIAGNOSIS — L851 Acquired keratosis [keratoderma] palmaris et plantaris: Secondary | ICD-10-CM | POA: Diagnosis not present

## 2017-05-28 DIAGNOSIS — E1142 Type 2 diabetes mellitus with diabetic polyneuropathy: Secondary | ICD-10-CM | POA: Diagnosis not present

## 2017-06-09 DIAGNOSIS — L720 Epidermal cyst: Secondary | ICD-10-CM | POA: Diagnosis not present

## 2017-06-15 DIAGNOSIS — L720 Epidermal cyst: Secondary | ICD-10-CM | POA: Diagnosis not present

## 2017-06-30 DIAGNOSIS — E119 Type 2 diabetes mellitus without complications: Secondary | ICD-10-CM | POA: Diagnosis not present

## 2017-06-30 DIAGNOSIS — I10 Essential (primary) hypertension: Secondary | ICD-10-CM | POA: Diagnosis not present

## 2017-06-30 DIAGNOSIS — Z1159 Encounter for screening for other viral diseases: Secondary | ICD-10-CM | POA: Diagnosis not present

## 2017-07-02 DIAGNOSIS — I10 Essential (primary) hypertension: Secondary | ICD-10-CM | POA: Diagnosis not present

## 2017-07-02 DIAGNOSIS — E782 Mixed hyperlipidemia: Secondary | ICD-10-CM | POA: Diagnosis not present

## 2017-07-02 DIAGNOSIS — Z0000 Encounter for general adult medical examination without abnormal findings: Secondary | ICD-10-CM | POA: Diagnosis not present

## 2017-07-02 DIAGNOSIS — G40909 Epilepsy, unspecified, not intractable, without status epilepticus: Secondary | ICD-10-CM | POA: Diagnosis not present

## 2017-07-02 DIAGNOSIS — K219 Gastro-esophageal reflux disease without esophagitis: Secondary | ICD-10-CM | POA: Diagnosis not present

## 2017-07-02 DIAGNOSIS — F209 Schizophrenia, unspecified: Secondary | ICD-10-CM | POA: Diagnosis not present

## 2017-07-02 DIAGNOSIS — E875 Hyperkalemia: Secondary | ICD-10-CM | POA: Diagnosis not present

## 2017-07-02 DIAGNOSIS — E119 Type 2 diabetes mellitus without complications: Secondary | ICD-10-CM | POA: Diagnosis not present

## 2017-07-14 DIAGNOSIS — F70 Mild intellectual disabilities: Secondary | ICD-10-CM | POA: Diagnosis not present

## 2017-08-06 DIAGNOSIS — E1142 Type 2 diabetes mellitus with diabetic polyneuropathy: Secondary | ICD-10-CM | POA: Diagnosis not present

## 2017-08-06 DIAGNOSIS — B351 Tinea unguium: Secondary | ICD-10-CM | POA: Diagnosis not present

## 2017-08-06 DIAGNOSIS — L851 Acquired keratosis [keratoderma] palmaris et plantaris: Secondary | ICD-10-CM | POA: Diagnosis not present

## 2017-09-28 DIAGNOSIS — Z6822 Body mass index (BMI) 22.0-22.9, adult: Secondary | ICD-10-CM | POA: Diagnosis not present

## 2017-09-28 DIAGNOSIS — E875 Hyperkalemia: Secondary | ICD-10-CM | POA: Diagnosis not present

## 2017-09-28 DIAGNOSIS — I10 Essential (primary) hypertension: Secondary | ICD-10-CM | POA: Diagnosis not present

## 2017-10-15 DIAGNOSIS — F70 Mild intellectual disabilities: Secondary | ICD-10-CM | POA: Diagnosis not present

## 2017-10-29 DIAGNOSIS — I10 Essential (primary) hypertension: Secondary | ICD-10-CM | POA: Diagnosis not present

## 2017-10-29 DIAGNOSIS — R079 Chest pain, unspecified: Secondary | ICD-10-CM | POA: Diagnosis not present

## 2017-10-29 DIAGNOSIS — Z79899 Other long term (current) drug therapy: Secondary | ICD-10-CM | POA: Diagnosis not present

## 2017-10-29 DIAGNOSIS — R569 Unspecified convulsions: Secondary | ICD-10-CM | POA: Diagnosis not present

## 2017-10-29 DIAGNOSIS — K219 Gastro-esophageal reflux disease without esophagitis: Secondary | ICD-10-CM | POA: Diagnosis not present

## 2017-10-29 DIAGNOSIS — E119 Type 2 diabetes mellitus without complications: Secondary | ICD-10-CM | POA: Diagnosis not present

## 2018-01-14 DIAGNOSIS — F70 Mild intellectual disabilities: Secondary | ICD-10-CM | POA: Diagnosis not present

## 2018-02-11 DIAGNOSIS — B351 Tinea unguium: Secondary | ICD-10-CM | POA: Diagnosis not present

## 2018-02-11 DIAGNOSIS — E1142 Type 2 diabetes mellitus with diabetic polyneuropathy: Secondary | ICD-10-CM | POA: Diagnosis not present

## 2018-02-11 DIAGNOSIS — E782 Mixed hyperlipidemia: Secondary | ICD-10-CM | POA: Diagnosis not present

## 2018-02-11 DIAGNOSIS — I10 Essential (primary) hypertension: Secondary | ICD-10-CM | POA: Diagnosis not present

## 2018-02-11 DIAGNOSIS — E119 Type 2 diabetes mellitus without complications: Secondary | ICD-10-CM | POA: Diagnosis not present

## 2018-02-11 DIAGNOSIS — L851 Acquired keratosis [keratoderma] palmaris et plantaris: Secondary | ICD-10-CM | POA: Diagnosis not present

## 2018-03-08 DIAGNOSIS — E875 Hyperkalemia: Secondary | ICD-10-CM | POA: Diagnosis not present

## 2018-03-08 DIAGNOSIS — E782 Mixed hyperlipidemia: Secondary | ICD-10-CM | POA: Diagnosis not present

## 2018-03-08 DIAGNOSIS — F209 Schizophrenia, unspecified: Secondary | ICD-10-CM | POA: Diagnosis not present

## 2018-03-08 DIAGNOSIS — G40909 Epilepsy, unspecified, not intractable, without status epilepticus: Secondary | ICD-10-CM | POA: Diagnosis not present

## 2018-03-08 DIAGNOSIS — E119 Type 2 diabetes mellitus without complications: Secondary | ICD-10-CM | POA: Diagnosis not present

## 2018-03-08 DIAGNOSIS — I10 Essential (primary) hypertension: Secondary | ICD-10-CM | POA: Diagnosis not present

## 2018-03-22 DIAGNOSIS — E119 Type 2 diabetes mellitus without complications: Secondary | ICD-10-CM | POA: Diagnosis not present

## 2018-03-22 DIAGNOSIS — E782 Mixed hyperlipidemia: Secondary | ICD-10-CM | POA: Diagnosis not present

## 2018-03-22 DIAGNOSIS — K219 Gastro-esophageal reflux disease without esophagitis: Secondary | ICD-10-CM | POA: Diagnosis not present

## 2018-03-22 DIAGNOSIS — E875 Hyperkalemia: Secondary | ICD-10-CM | POA: Diagnosis not present

## 2018-03-22 DIAGNOSIS — G3184 Mild cognitive impairment, so stated: Secondary | ICD-10-CM | POA: Diagnosis not present

## 2018-03-22 DIAGNOSIS — F209 Schizophrenia, unspecified: Secondary | ICD-10-CM | POA: Diagnosis not present

## 2018-03-22 DIAGNOSIS — F5101 Primary insomnia: Secondary | ICD-10-CM | POA: Diagnosis not present

## 2018-03-22 DIAGNOSIS — Z6824 Body mass index (BMI) 24.0-24.9, adult: Secondary | ICD-10-CM | POA: Diagnosis not present

## 2018-03-22 DIAGNOSIS — F09 Unspecified mental disorder due to known physiological condition: Secondary | ICD-10-CM | POA: Diagnosis not present

## 2018-03-22 DIAGNOSIS — I10 Essential (primary) hypertension: Secondary | ICD-10-CM | POA: Diagnosis not present

## 2018-03-22 DIAGNOSIS — G40909 Epilepsy, unspecified, not intractable, without status epilepticus: Secondary | ICD-10-CM | POA: Diagnosis not present

## 2018-04-15 DIAGNOSIS — F70 Mild intellectual disabilities: Secondary | ICD-10-CM | POA: Diagnosis not present

## 2018-05-06 DIAGNOSIS — E1142 Type 2 diabetes mellitus with diabetic polyneuropathy: Secondary | ICD-10-CM | POA: Diagnosis not present

## 2018-05-06 DIAGNOSIS — B351 Tinea unguium: Secondary | ICD-10-CM | POA: Diagnosis not present

## 2018-05-06 DIAGNOSIS — L851 Acquired keratosis [keratoderma] palmaris et plantaris: Secondary | ICD-10-CM | POA: Diagnosis not present

## 2018-07-06 DIAGNOSIS — G40909 Epilepsy, unspecified, not intractable, without status epilepticus: Secondary | ICD-10-CM | POA: Diagnosis not present

## 2018-07-06 DIAGNOSIS — E1169 Type 2 diabetes mellitus with other specified complication: Secondary | ICD-10-CM | POA: Diagnosis not present

## 2018-07-06 DIAGNOSIS — E782 Mixed hyperlipidemia: Secondary | ICD-10-CM | POA: Diagnosis not present

## 2018-07-06 DIAGNOSIS — Z6824 Body mass index (BMI) 24.0-24.9, adult: Secondary | ICD-10-CM | POA: Diagnosis not present

## 2018-07-06 DIAGNOSIS — R109 Unspecified abdominal pain: Secondary | ICD-10-CM | POA: Diagnosis not present

## 2018-07-06 DIAGNOSIS — F209 Schizophrenia, unspecified: Secondary | ICD-10-CM | POA: Diagnosis not present

## 2018-07-06 DIAGNOSIS — I10 Essential (primary) hypertension: Secondary | ICD-10-CM | POA: Diagnosis not present

## 2018-07-15 DIAGNOSIS — F70 Mild intellectual disabilities: Secondary | ICD-10-CM | POA: Diagnosis not present

## 2018-07-19 DIAGNOSIS — B351 Tinea unguium: Secondary | ICD-10-CM | POA: Diagnosis not present

## 2018-07-19 DIAGNOSIS — E1142 Type 2 diabetes mellitus with diabetic polyneuropathy: Secondary | ICD-10-CM | POA: Diagnosis not present

## 2018-07-19 DIAGNOSIS — L851 Acquired keratosis [keratoderma] palmaris et plantaris: Secondary | ICD-10-CM | POA: Diagnosis not present

## 2018-09-27 DIAGNOSIS — L851 Acquired keratosis [keratoderma] palmaris et plantaris: Secondary | ICD-10-CM | POA: Diagnosis not present

## 2018-09-27 DIAGNOSIS — E1142 Type 2 diabetes mellitus with diabetic polyneuropathy: Secondary | ICD-10-CM | POA: Diagnosis not present

## 2018-09-27 DIAGNOSIS — B351 Tinea unguium: Secondary | ICD-10-CM | POA: Diagnosis not present

## 2018-09-27 DIAGNOSIS — Z23 Encounter for immunization: Secondary | ICD-10-CM | POA: Diagnosis not present

## 2018-09-29 DIAGNOSIS — E11319 Type 2 diabetes mellitus with unspecified diabetic retinopathy without macular edema: Secondary | ICD-10-CM | POA: Diagnosis not present

## 2018-10-11 DIAGNOSIS — I10 Essential (primary) hypertension: Secondary | ICD-10-CM | POA: Diagnosis not present

## 2018-10-11 DIAGNOSIS — E119 Type 2 diabetes mellitus without complications: Secondary | ICD-10-CM | POA: Diagnosis not present

## 2018-10-11 DIAGNOSIS — E782 Mixed hyperlipidemia: Secondary | ICD-10-CM | POA: Diagnosis not present

## 2018-10-11 DIAGNOSIS — E1169 Type 2 diabetes mellitus with other specified complication: Secondary | ICD-10-CM | POA: Diagnosis not present

## 2018-10-18 DIAGNOSIS — Z0000 Encounter for general adult medical examination without abnormal findings: Secondary | ICD-10-CM | POA: Diagnosis not present

## 2018-10-18 DIAGNOSIS — I10 Essential (primary) hypertension: Secondary | ICD-10-CM | POA: Diagnosis not present

## 2018-10-18 DIAGNOSIS — E1169 Type 2 diabetes mellitus with other specified complication: Secondary | ICD-10-CM | POA: Diagnosis not present

## 2018-10-18 DIAGNOSIS — K219 Gastro-esophageal reflux disease without esophagitis: Secondary | ICD-10-CM | POA: Diagnosis not present

## 2018-10-18 DIAGNOSIS — J060 Acute laryngopharyngitis: Secondary | ICD-10-CM | POA: Diagnosis not present

## 2018-10-18 DIAGNOSIS — F209 Schizophrenia, unspecified: Secondary | ICD-10-CM | POA: Diagnosis not present

## 2018-10-18 DIAGNOSIS — G40909 Epilepsy, unspecified, not intractable, without status epilepticus: Secondary | ICD-10-CM | POA: Diagnosis not present

## 2018-10-18 DIAGNOSIS — E782 Mixed hyperlipidemia: Secondary | ICD-10-CM | POA: Diagnosis not present

## 2018-10-18 DIAGNOSIS — R197 Diarrhea, unspecified: Secondary | ICD-10-CM | POA: Diagnosis not present

## 2018-10-18 DIAGNOSIS — Z6824 Body mass index (BMI) 24.0-24.9, adult: Secondary | ICD-10-CM | POA: Diagnosis not present

## 2018-10-20 DIAGNOSIS — S90122A Contusion of left lesser toe(s) without damage to nail, initial encounter: Secondary | ICD-10-CM | POA: Diagnosis not present

## 2018-10-20 DIAGNOSIS — W228XXA Striking against or struck by other objects, initial encounter: Secondary | ICD-10-CM | POA: Diagnosis not present

## 2018-10-20 DIAGNOSIS — R569 Unspecified convulsions: Secondary | ICD-10-CM | POA: Diagnosis not present

## 2018-10-20 DIAGNOSIS — I10 Essential (primary) hypertension: Secondary | ICD-10-CM | POA: Diagnosis not present

## 2018-10-20 DIAGNOSIS — S9032XA Contusion of left foot, initial encounter: Secondary | ICD-10-CM | POA: Diagnosis not present

## 2018-10-20 DIAGNOSIS — S99922A Unspecified injury of left foot, initial encounter: Secondary | ICD-10-CM | POA: Diagnosis not present

## 2018-10-20 DIAGNOSIS — K219 Gastro-esophageal reflux disease without esophagitis: Secondary | ICD-10-CM | POA: Diagnosis not present

## 2018-10-20 DIAGNOSIS — E119 Type 2 diabetes mellitus without complications: Secondary | ICD-10-CM | POA: Diagnosis not present

## 2018-10-20 DIAGNOSIS — M79675 Pain in left toe(s): Secondary | ICD-10-CM | POA: Diagnosis not present

## 2018-10-20 DIAGNOSIS — Z79899 Other long term (current) drug therapy: Secondary | ICD-10-CM | POA: Diagnosis not present

## 2018-10-21 DIAGNOSIS — F70 Mild intellectual disabilities: Secondary | ICD-10-CM | POA: Diagnosis not present

## 2019-02-07 DIAGNOSIS — G40909 Epilepsy, unspecified, not intractable, without status epilepticus: Secondary | ICD-10-CM | POA: Diagnosis not present

## 2019-02-07 DIAGNOSIS — G3184 Mild cognitive impairment, so stated: Secondary | ICD-10-CM | POA: Diagnosis not present

## 2019-02-07 DIAGNOSIS — E782 Mixed hyperlipidemia: Secondary | ICD-10-CM | POA: Diagnosis not present

## 2019-02-07 DIAGNOSIS — I10 Essential (primary) hypertension: Secondary | ICD-10-CM | POA: Diagnosis not present

## 2019-02-10 DIAGNOSIS — L851 Acquired keratosis [keratoderma] palmaris et plantaris: Secondary | ICD-10-CM | POA: Diagnosis not present

## 2019-02-10 DIAGNOSIS — B351 Tinea unguium: Secondary | ICD-10-CM | POA: Diagnosis not present

## 2019-02-10 DIAGNOSIS — E1142 Type 2 diabetes mellitus with diabetic polyneuropathy: Secondary | ICD-10-CM | POA: Diagnosis not present

## 2019-02-13 DIAGNOSIS — E119 Type 2 diabetes mellitus without complications: Secondary | ICD-10-CM | POA: Diagnosis not present

## 2019-02-13 DIAGNOSIS — K219 Gastro-esophageal reflux disease without esophagitis: Secondary | ICD-10-CM | POA: Diagnosis not present

## 2019-02-13 DIAGNOSIS — I10 Essential (primary) hypertension: Secondary | ICD-10-CM | POA: Diagnosis not present

## 2019-02-13 DIAGNOSIS — Z79891 Long term (current) use of opiate analgesic: Secondary | ICD-10-CM | POA: Diagnosis not present

## 2019-02-13 DIAGNOSIS — R42 Dizziness and giddiness: Secondary | ICD-10-CM | POA: Diagnosis not present

## 2019-02-13 DIAGNOSIS — R569 Unspecified convulsions: Secondary | ICD-10-CM | POA: Diagnosis not present

## 2019-02-13 DIAGNOSIS — Z79899 Other long term (current) drug therapy: Secondary | ICD-10-CM | POA: Diagnosis not present

## 2019-02-13 DIAGNOSIS — R112 Nausea with vomiting, unspecified: Secondary | ICD-10-CM | POA: Diagnosis not present

## 2019-02-13 DIAGNOSIS — B349 Viral infection, unspecified: Secondary | ICD-10-CM | POA: Diagnosis not present

## 2019-03-14 DIAGNOSIS — F70 Mild intellectual disabilities: Secondary | ICD-10-CM | POA: Diagnosis not present

## 2019-03-28 DIAGNOSIS — E782 Mixed hyperlipidemia: Secondary | ICD-10-CM | POA: Diagnosis not present

## 2019-03-28 DIAGNOSIS — K219 Gastro-esophageal reflux disease without esophagitis: Secondary | ICD-10-CM | POA: Diagnosis not present

## 2019-03-28 DIAGNOSIS — G40909 Epilepsy, unspecified, not intractable, without status epilepticus: Secondary | ICD-10-CM | POA: Diagnosis not present

## 2019-03-28 DIAGNOSIS — I10 Essential (primary) hypertension: Secondary | ICD-10-CM | POA: Diagnosis not present

## 2019-03-28 DIAGNOSIS — E119 Type 2 diabetes mellitus without complications: Secondary | ICD-10-CM | POA: Diagnosis not present

## 2019-03-28 DIAGNOSIS — E1169 Type 2 diabetes mellitus with other specified complication: Secondary | ICD-10-CM | POA: Diagnosis not present

## 2019-03-29 DIAGNOSIS — G40909 Epilepsy, unspecified, not intractable, without status epilepticus: Secondary | ICD-10-CM | POA: Diagnosis not present

## 2019-03-29 DIAGNOSIS — E441 Mild protein-calorie malnutrition: Secondary | ICD-10-CM | POA: Diagnosis not present

## 2019-03-29 DIAGNOSIS — F209 Schizophrenia, unspecified: Secondary | ICD-10-CM | POA: Diagnosis not present

## 2019-03-29 DIAGNOSIS — K219 Gastro-esophageal reflux disease without esophagitis: Secondary | ICD-10-CM | POA: Diagnosis not present

## 2019-03-29 DIAGNOSIS — E119 Type 2 diabetes mellitus without complications: Secondary | ICD-10-CM | POA: Diagnosis not present

## 2019-03-29 DIAGNOSIS — R4689 Other symptoms and signs involving appearance and behavior: Secondary | ICD-10-CM | POA: Diagnosis not present

## 2019-03-29 DIAGNOSIS — I10 Essential (primary) hypertension: Secondary | ICD-10-CM | POA: Diagnosis not present

## 2019-03-29 DIAGNOSIS — E785 Hyperlipidemia, unspecified: Secondary | ICD-10-CM | POA: Diagnosis not present

## 2019-05-26 DIAGNOSIS — I10 Essential (primary) hypertension: Secondary | ICD-10-CM | POA: Diagnosis not present

## 2019-05-26 DIAGNOSIS — E785 Hyperlipidemia, unspecified: Secondary | ICD-10-CM | POA: Diagnosis not present

## 2019-05-26 DIAGNOSIS — F209 Schizophrenia, unspecified: Secondary | ICD-10-CM | POA: Diagnosis not present

## 2019-05-26 DIAGNOSIS — R32 Unspecified urinary incontinence: Secondary | ICD-10-CM | POA: Diagnosis not present

## 2019-05-26 DIAGNOSIS — R109 Unspecified abdominal pain: Secondary | ICD-10-CM | POA: Diagnosis not present

## 2019-05-26 DIAGNOSIS — E1169 Type 2 diabetes mellitus with other specified complication: Secondary | ICD-10-CM | POA: Diagnosis not present

## 2019-05-26 DIAGNOSIS — G40909 Epilepsy, unspecified, not intractable, without status epilepticus: Secondary | ICD-10-CM | POA: Diagnosis not present

## 2019-05-26 DIAGNOSIS — E875 Hyperkalemia: Secondary | ICD-10-CM | POA: Diagnosis not present

## 2019-05-26 DIAGNOSIS — F09 Unspecified mental disorder due to known physiological condition: Secondary | ICD-10-CM | POA: Diagnosis not present

## 2019-05-26 DIAGNOSIS — G3184 Mild cognitive impairment, so stated: Secondary | ICD-10-CM | POA: Diagnosis not present

## 2019-05-26 DIAGNOSIS — E119 Type 2 diabetes mellitus without complications: Secondary | ICD-10-CM | POA: Diagnosis not present

## 2019-05-26 DIAGNOSIS — E782 Mixed hyperlipidemia: Secondary | ICD-10-CM | POA: Diagnosis not present

## 2019-05-26 DIAGNOSIS — F5101 Primary insomnia: Secondary | ICD-10-CM | POA: Diagnosis not present

## 2019-05-26 DIAGNOSIS — K219 Gastro-esophageal reflux disease without esophagitis: Secondary | ICD-10-CM | POA: Diagnosis not present

## 2019-05-26 DIAGNOSIS — E441 Mild protein-calorie malnutrition: Secondary | ICD-10-CM | POA: Diagnosis not present

## 2019-06-02 DIAGNOSIS — E1142 Type 2 diabetes mellitus with diabetic polyneuropathy: Secondary | ICD-10-CM | POA: Diagnosis not present

## 2019-06-02 DIAGNOSIS — L851 Acquired keratosis [keratoderma] palmaris et plantaris: Secondary | ICD-10-CM | POA: Diagnosis not present

## 2019-06-02 DIAGNOSIS — B351 Tinea unguium: Secondary | ICD-10-CM | POA: Diagnosis not present

## 2019-06-20 DIAGNOSIS — F70 Mild intellectual disabilities: Secondary | ICD-10-CM | POA: Diagnosis not present

## 2019-07-11 DIAGNOSIS — E119 Type 2 diabetes mellitus without complications: Secondary | ICD-10-CM | POA: Diagnosis not present

## 2019-07-11 DIAGNOSIS — F5101 Primary insomnia: Secondary | ICD-10-CM | POA: Diagnosis not present

## 2019-07-11 DIAGNOSIS — I10 Essential (primary) hypertension: Secondary | ICD-10-CM | POA: Diagnosis not present

## 2019-07-11 DIAGNOSIS — K219 Gastro-esophageal reflux disease without esophagitis: Secondary | ICD-10-CM | POA: Diagnosis not present

## 2019-07-11 DIAGNOSIS — G40909 Epilepsy, unspecified, not intractable, without status epilepticus: Secondary | ICD-10-CM | POA: Diagnosis not present

## 2019-07-11 DIAGNOSIS — E875 Hyperkalemia: Secondary | ICD-10-CM | POA: Diagnosis not present

## 2019-07-11 DIAGNOSIS — E782 Mixed hyperlipidemia: Secondary | ICD-10-CM | POA: Diagnosis not present

## 2019-07-11 DIAGNOSIS — G3184 Mild cognitive impairment, so stated: Secondary | ICD-10-CM | POA: Diagnosis not present

## 2019-07-11 DIAGNOSIS — F209 Schizophrenia, unspecified: Secondary | ICD-10-CM | POA: Diagnosis not present

## 2019-07-11 DIAGNOSIS — F09 Unspecified mental disorder due to known physiological condition: Secondary | ICD-10-CM | POA: Diagnosis not present

## 2019-07-25 DIAGNOSIS — E1169 Type 2 diabetes mellitus with other specified complication: Secondary | ICD-10-CM | POA: Diagnosis not present

## 2019-07-25 DIAGNOSIS — F09 Unspecified mental disorder due to known physiological condition: Secondary | ICD-10-CM | POA: Diagnosis not present

## 2019-07-25 DIAGNOSIS — R300 Dysuria: Secondary | ICD-10-CM | POA: Diagnosis not present

## 2019-07-25 DIAGNOSIS — G40909 Epilepsy, unspecified, not intractable, without status epilepticus: Secondary | ICD-10-CM | POA: Diagnosis not present

## 2019-07-25 DIAGNOSIS — I10 Essential (primary) hypertension: Secondary | ICD-10-CM | POA: Diagnosis not present

## 2019-07-25 DIAGNOSIS — K219 Gastro-esophageal reflux disease without esophagitis: Secondary | ICD-10-CM | POA: Diagnosis not present

## 2019-07-25 DIAGNOSIS — K5900 Constipation, unspecified: Secondary | ICD-10-CM | POA: Diagnosis not present

## 2019-07-25 DIAGNOSIS — G3184 Mild cognitive impairment, so stated: Secondary | ICD-10-CM | POA: Diagnosis not present

## 2019-07-25 DIAGNOSIS — R109 Unspecified abdominal pain: Secondary | ICD-10-CM | POA: Diagnosis not present

## 2019-07-25 DIAGNOSIS — F209 Schizophrenia, unspecified: Secondary | ICD-10-CM | POA: Diagnosis not present

## 2019-07-25 DIAGNOSIS — E782 Mixed hyperlipidemia: Secondary | ICD-10-CM | POA: Diagnosis not present

## 2019-07-25 DIAGNOSIS — E875 Hyperkalemia: Secondary | ICD-10-CM | POA: Diagnosis not present

## 2019-07-25 DIAGNOSIS — F5101 Primary insomnia: Secondary | ICD-10-CM | POA: Diagnosis not present

## 2019-07-25 DIAGNOSIS — E119 Type 2 diabetes mellitus without complications: Secondary | ICD-10-CM | POA: Diagnosis not present

## 2019-08-11 DIAGNOSIS — B351 Tinea unguium: Secondary | ICD-10-CM | POA: Diagnosis not present

## 2019-08-11 DIAGNOSIS — R5381 Other malaise: Secondary | ICD-10-CM | POA: Diagnosis not present

## 2019-08-11 DIAGNOSIS — W19XXXA Unspecified fall, initial encounter: Secondary | ICD-10-CM | POA: Diagnosis not present

## 2019-08-11 DIAGNOSIS — L851 Acquired keratosis [keratoderma] palmaris et plantaris: Secondary | ICD-10-CM | POA: Diagnosis not present

## 2019-08-11 DIAGNOSIS — E1142 Type 2 diabetes mellitus with diabetic polyneuropathy: Secondary | ICD-10-CM | POA: Diagnosis not present

## 2019-08-11 DIAGNOSIS — Z23 Encounter for immunization: Secondary | ICD-10-CM | POA: Diagnosis not present

## 2019-08-11 DIAGNOSIS — M25511 Pain in right shoulder: Secondary | ICD-10-CM | POA: Diagnosis not present

## 2019-08-11 DIAGNOSIS — Z209 Contact with and (suspected) exposure to unspecified communicable disease: Secondary | ICD-10-CM | POA: Diagnosis not present

## 2019-08-11 DIAGNOSIS — S40211A Abrasion of right shoulder, initial encounter: Secondary | ICD-10-CM | POA: Diagnosis not present

## 2019-08-11 DIAGNOSIS — S30810A Abrasion of lower back and pelvis, initial encounter: Secondary | ICD-10-CM | POA: Diagnosis not present

## 2019-09-26 DIAGNOSIS — E119 Type 2 diabetes mellitus without complications: Secondary | ICD-10-CM | POA: Diagnosis not present

## 2019-09-26 DIAGNOSIS — E785 Hyperlipidemia, unspecified: Secondary | ICD-10-CM | POA: Diagnosis not present

## 2019-09-26 DIAGNOSIS — I10 Essential (primary) hypertension: Secondary | ICD-10-CM | POA: Diagnosis not present

## 2019-09-26 DIAGNOSIS — E1169 Type 2 diabetes mellitus with other specified complication: Secondary | ICD-10-CM | POA: Diagnosis not present

## 2019-09-26 DIAGNOSIS — E782 Mixed hyperlipidemia: Secondary | ICD-10-CM | POA: Diagnosis not present

## 2019-09-28 DIAGNOSIS — F70 Mild intellectual disabilities: Secondary | ICD-10-CM | POA: Diagnosis not present

## 2019-10-09 DIAGNOSIS — Z23 Encounter for immunization: Secondary | ICD-10-CM | POA: Diagnosis not present

## 2019-10-11 DIAGNOSIS — E1169 Type 2 diabetes mellitus with other specified complication: Secondary | ICD-10-CM | POA: Diagnosis not present

## 2019-10-11 DIAGNOSIS — G40909 Epilepsy, unspecified, not intractable, without status epilepticus: Secondary | ICD-10-CM | POA: Diagnosis not present

## 2019-10-11 DIAGNOSIS — J060 Acute laryngopharyngitis: Secondary | ICD-10-CM | POA: Diagnosis not present

## 2019-10-11 DIAGNOSIS — E782 Mixed hyperlipidemia: Secondary | ICD-10-CM | POA: Diagnosis not present

## 2019-10-11 DIAGNOSIS — K219 Gastro-esophageal reflux disease without esophagitis: Secondary | ICD-10-CM | POA: Diagnosis not present

## 2019-10-11 DIAGNOSIS — I10 Essential (primary) hypertension: Secondary | ICD-10-CM | POA: Diagnosis not present

## 2019-10-11 DIAGNOSIS — F209 Schizophrenia, unspecified: Secondary | ICD-10-CM | POA: Diagnosis not present

## 2019-10-11 DIAGNOSIS — E878 Other disorders of electrolyte and fluid balance, not elsewhere classified: Secondary | ICD-10-CM | POA: Diagnosis not present

## 2019-10-11 DIAGNOSIS — E441 Mild protein-calorie malnutrition: Secondary | ICD-10-CM | POA: Diagnosis not present

## 2019-10-13 DIAGNOSIS — I10 Essential (primary) hypertension: Secondary | ICD-10-CM | POA: Diagnosis not present

## 2019-10-13 DIAGNOSIS — G40909 Epilepsy, unspecified, not intractable, without status epilepticus: Secondary | ICD-10-CM | POA: Diagnosis not present

## 2019-10-13 DIAGNOSIS — F209 Schizophrenia, unspecified: Secondary | ICD-10-CM | POA: Diagnosis not present

## 2019-10-13 DIAGNOSIS — E875 Hyperkalemia: Secondary | ICD-10-CM | POA: Diagnosis not present

## 2019-10-13 DIAGNOSIS — F09 Unspecified mental disorder due to known physiological condition: Secondary | ICD-10-CM | POA: Diagnosis not present

## 2019-10-13 DIAGNOSIS — E782 Mixed hyperlipidemia: Secondary | ICD-10-CM | POA: Diagnosis not present

## 2019-10-13 DIAGNOSIS — K219 Gastro-esophageal reflux disease without esophagitis: Secondary | ICD-10-CM | POA: Diagnosis not present

## 2019-10-13 DIAGNOSIS — Z6824 Body mass index (BMI) 24.0-24.9, adult: Secondary | ICD-10-CM | POA: Diagnosis not present

## 2019-10-13 DIAGNOSIS — G3184 Mild cognitive impairment, so stated: Secondary | ICD-10-CM | POA: Diagnosis not present

## 2019-10-13 DIAGNOSIS — E119 Type 2 diabetes mellitus without complications: Secondary | ICD-10-CM | POA: Diagnosis not present

## 2019-10-13 DIAGNOSIS — F5101 Primary insomnia: Secondary | ICD-10-CM | POA: Diagnosis not present

## 2019-10-20 DIAGNOSIS — L851 Acquired keratosis [keratoderma] palmaris et plantaris: Secondary | ICD-10-CM | POA: Diagnosis not present

## 2019-10-20 DIAGNOSIS — B351 Tinea unguium: Secondary | ICD-10-CM | POA: Diagnosis not present

## 2019-10-20 DIAGNOSIS — E1142 Type 2 diabetes mellitus with diabetic polyneuropathy: Secondary | ICD-10-CM | POA: Diagnosis not present

## 2019-11-03 DIAGNOSIS — F09 Unspecified mental disorder due to known physiological condition: Secondary | ICD-10-CM | POA: Diagnosis not present

## 2019-11-03 DIAGNOSIS — Z6824 Body mass index (BMI) 24.0-24.9, adult: Secondary | ICD-10-CM | POA: Diagnosis not present

## 2019-11-03 DIAGNOSIS — I10 Essential (primary) hypertension: Secondary | ICD-10-CM | POA: Diagnosis not present

## 2019-11-03 DIAGNOSIS — E119 Type 2 diabetes mellitus without complications: Secondary | ICD-10-CM | POA: Diagnosis not present

## 2019-11-03 DIAGNOSIS — E782 Mixed hyperlipidemia: Secondary | ICD-10-CM | POA: Diagnosis not present

## 2019-11-03 DIAGNOSIS — E875 Hyperkalemia: Secondary | ICD-10-CM | POA: Diagnosis not present

## 2019-11-03 DIAGNOSIS — F209 Schizophrenia, unspecified: Secondary | ICD-10-CM | POA: Diagnosis not present

## 2019-11-03 DIAGNOSIS — K219 Gastro-esophageal reflux disease without esophagitis: Secondary | ICD-10-CM | POA: Diagnosis not present

## 2019-11-03 DIAGNOSIS — G3184 Mild cognitive impairment, so stated: Secondary | ICD-10-CM | POA: Diagnosis not present

## 2019-11-03 DIAGNOSIS — G40909 Epilepsy, unspecified, not intractable, without status epilepticus: Secondary | ICD-10-CM | POA: Diagnosis not present

## 2019-11-03 DIAGNOSIS — F5101 Primary insomnia: Secondary | ICD-10-CM | POA: Diagnosis not present

## 2020-01-17 DIAGNOSIS — F70 Mild intellectual disabilities: Secondary | ICD-10-CM | POA: Diagnosis not present

## 2020-01-26 DIAGNOSIS — E1169 Type 2 diabetes mellitus with other specified complication: Secondary | ICD-10-CM | POA: Diagnosis not present

## 2020-01-26 DIAGNOSIS — B351 Tinea unguium: Secondary | ICD-10-CM | POA: Diagnosis not present

## 2020-01-26 DIAGNOSIS — E875 Hyperkalemia: Secondary | ICD-10-CM | POA: Diagnosis not present

## 2020-01-26 DIAGNOSIS — F5101 Primary insomnia: Secondary | ICD-10-CM | POA: Diagnosis not present

## 2020-01-26 DIAGNOSIS — G3184 Mild cognitive impairment, so stated: Secondary | ICD-10-CM | POA: Diagnosis not present

## 2020-01-26 DIAGNOSIS — R109 Unspecified abdominal pain: Secondary | ICD-10-CM | POA: Diagnosis not present

## 2020-01-26 DIAGNOSIS — L851 Acquired keratosis [keratoderma] palmaris et plantaris: Secondary | ICD-10-CM | POA: Diagnosis not present

## 2020-01-26 DIAGNOSIS — E119 Type 2 diabetes mellitus without complications: Secondary | ICD-10-CM | POA: Diagnosis not present

## 2020-01-26 DIAGNOSIS — K219 Gastro-esophageal reflux disease without esophagitis: Secondary | ICD-10-CM | POA: Diagnosis not present

## 2020-01-26 DIAGNOSIS — F09 Unspecified mental disorder due to known physiological condition: Secondary | ICD-10-CM | POA: Diagnosis not present

## 2020-01-26 DIAGNOSIS — E782 Mixed hyperlipidemia: Secondary | ICD-10-CM | POA: Diagnosis not present

## 2020-01-26 DIAGNOSIS — F209 Schizophrenia, unspecified: Secondary | ICD-10-CM | POA: Diagnosis not present

## 2020-01-26 DIAGNOSIS — I10 Essential (primary) hypertension: Secondary | ICD-10-CM | POA: Diagnosis not present

## 2020-01-26 DIAGNOSIS — E1142 Type 2 diabetes mellitus with diabetic polyneuropathy: Secondary | ICD-10-CM | POA: Diagnosis not present

## 2020-01-26 DIAGNOSIS — G40909 Epilepsy, unspecified, not intractable, without status epilepticus: Secondary | ICD-10-CM | POA: Diagnosis not present

## 2020-02-01 DIAGNOSIS — G40909 Epilepsy, unspecified, not intractable, without status epilepticus: Secondary | ICD-10-CM | POA: Diagnosis not present

## 2020-02-01 DIAGNOSIS — I10 Essential (primary) hypertension: Secondary | ICD-10-CM | POA: Diagnosis not present

## 2020-02-01 DIAGNOSIS — E1169 Type 2 diabetes mellitus with other specified complication: Secondary | ICD-10-CM | POA: Diagnosis not present

## 2020-02-01 DIAGNOSIS — F209 Schizophrenia, unspecified: Secondary | ICD-10-CM | POA: Diagnosis not present

## 2020-02-01 DIAGNOSIS — K219 Gastro-esophageal reflux disease without esophagitis: Secondary | ICD-10-CM | POA: Diagnosis not present

## 2020-02-01 DIAGNOSIS — E878 Other disorders of electrolyte and fluid balance, not elsewhere classified: Secondary | ICD-10-CM | POA: Diagnosis not present

## 2020-02-01 DIAGNOSIS — E782 Mixed hyperlipidemia: Secondary | ICD-10-CM | POA: Diagnosis not present

## 2020-02-01 DIAGNOSIS — E441 Mild protein-calorie malnutrition: Secondary | ICD-10-CM | POA: Diagnosis not present

## 2020-02-11 DIAGNOSIS — E875 Hyperkalemia: Secondary | ICD-10-CM | POA: Diagnosis not present

## 2020-02-11 DIAGNOSIS — E119 Type 2 diabetes mellitus without complications: Secondary | ICD-10-CM | POA: Diagnosis not present

## 2020-02-11 DIAGNOSIS — G40909 Epilepsy, unspecified, not intractable, without status epilepticus: Secondary | ICD-10-CM | POA: Diagnosis not present

## 2020-02-11 DIAGNOSIS — I10 Essential (primary) hypertension: Secondary | ICD-10-CM | POA: Diagnosis not present

## 2020-02-11 DIAGNOSIS — F5101 Primary insomnia: Secondary | ICD-10-CM | POA: Diagnosis not present

## 2020-02-11 DIAGNOSIS — G3184 Mild cognitive impairment, so stated: Secondary | ICD-10-CM | POA: Diagnosis not present

## 2020-02-11 DIAGNOSIS — F209 Schizophrenia, unspecified: Secondary | ICD-10-CM | POA: Diagnosis not present

## 2020-02-11 DIAGNOSIS — F09 Unspecified mental disorder due to known physiological condition: Secondary | ICD-10-CM | POA: Diagnosis not present

## 2020-02-11 DIAGNOSIS — Z6824 Body mass index (BMI) 24.0-24.9, adult: Secondary | ICD-10-CM | POA: Diagnosis not present

## 2020-02-11 DIAGNOSIS — K219 Gastro-esophageal reflux disease without esophagitis: Secondary | ICD-10-CM | POA: Diagnosis not present

## 2020-02-11 DIAGNOSIS — E782 Mixed hyperlipidemia: Secondary | ICD-10-CM | POA: Diagnosis not present

## 2020-02-15 DIAGNOSIS — F70 Mild intellectual disabilities: Secondary | ICD-10-CM | POA: Diagnosis not present

## 2020-02-26 DIAGNOSIS — Z23 Encounter for immunization: Secondary | ICD-10-CM | POA: Diagnosis not present

## 2020-03-26 DIAGNOSIS — Z23 Encounter for immunization: Secondary | ICD-10-CM | POA: Diagnosis not present

## 2020-04-05 DIAGNOSIS — B351 Tinea unguium: Secondary | ICD-10-CM | POA: Diagnosis not present

## 2020-04-05 DIAGNOSIS — L851 Acquired keratosis [keratoderma] palmaris et plantaris: Secondary | ICD-10-CM | POA: Diagnosis not present

## 2020-04-05 DIAGNOSIS — E1142 Type 2 diabetes mellitus with diabetic polyneuropathy: Secondary | ICD-10-CM | POA: Diagnosis not present

## 2020-04-18 DIAGNOSIS — F70 Mild intellectual disabilities: Secondary | ICD-10-CM | POA: Diagnosis not present

## 2020-06-05 DIAGNOSIS — F09 Unspecified mental disorder due to known physiological condition: Secondary | ICD-10-CM | POA: Diagnosis not present

## 2020-06-05 DIAGNOSIS — G40909 Epilepsy, unspecified, not intractable, without status epilepticus: Secondary | ICD-10-CM | POA: Diagnosis not present

## 2020-06-05 DIAGNOSIS — F209 Schizophrenia, unspecified: Secondary | ICD-10-CM | POA: Diagnosis not present

## 2020-06-05 DIAGNOSIS — E1169 Type 2 diabetes mellitus with other specified complication: Secondary | ICD-10-CM | POA: Diagnosis not present

## 2020-06-05 DIAGNOSIS — I10 Essential (primary) hypertension: Secondary | ICD-10-CM | POA: Diagnosis not present

## 2020-06-05 DIAGNOSIS — E782 Mixed hyperlipidemia: Secondary | ICD-10-CM | POA: Diagnosis not present

## 2020-06-05 DIAGNOSIS — F5101 Primary insomnia: Secondary | ICD-10-CM | POA: Diagnosis not present

## 2020-06-05 DIAGNOSIS — E119 Type 2 diabetes mellitus without complications: Secondary | ICD-10-CM | POA: Diagnosis not present

## 2020-06-05 DIAGNOSIS — E441 Mild protein-calorie malnutrition: Secondary | ICD-10-CM | POA: Diagnosis not present

## 2020-06-05 DIAGNOSIS — E875 Hyperkalemia: Secondary | ICD-10-CM | POA: Diagnosis not present

## 2020-06-05 DIAGNOSIS — G3184 Mild cognitive impairment, so stated: Secondary | ICD-10-CM | POA: Diagnosis not present

## 2020-06-05 DIAGNOSIS — E785 Hyperlipidemia, unspecified: Secondary | ICD-10-CM | POA: Diagnosis not present

## 2020-07-02 DIAGNOSIS — K219 Gastro-esophageal reflux disease without esophagitis: Secondary | ICD-10-CM | POA: Diagnosis not present

## 2020-07-02 DIAGNOSIS — I10 Essential (primary) hypertension: Secondary | ICD-10-CM | POA: Diagnosis not present

## 2020-07-02 DIAGNOSIS — G40909 Epilepsy, unspecified, not intractable, without status epilepticus: Secondary | ICD-10-CM | POA: Diagnosis not present

## 2020-07-02 DIAGNOSIS — G3184 Mild cognitive impairment, so stated: Secondary | ICD-10-CM | POA: Diagnosis not present

## 2020-07-02 DIAGNOSIS — E875 Hyperkalemia: Secondary | ICD-10-CM | POA: Diagnosis not present

## 2020-07-02 DIAGNOSIS — F09 Unspecified mental disorder due to known physiological condition: Secondary | ICD-10-CM | POA: Diagnosis not present

## 2020-07-02 DIAGNOSIS — F5101 Primary insomnia: Secondary | ICD-10-CM | POA: Diagnosis not present

## 2020-07-02 DIAGNOSIS — E782 Mixed hyperlipidemia: Secondary | ICD-10-CM | POA: Diagnosis not present

## 2020-07-02 DIAGNOSIS — E1169 Type 2 diabetes mellitus with other specified complication: Secondary | ICD-10-CM | POA: Diagnosis not present

## 2020-07-02 DIAGNOSIS — F209 Schizophrenia, unspecified: Secondary | ICD-10-CM | POA: Diagnosis not present

## 2020-07-02 DIAGNOSIS — E119 Type 2 diabetes mellitus without complications: Secondary | ICD-10-CM | POA: Diagnosis not present

## 2020-07-02 DIAGNOSIS — R109 Unspecified abdominal pain: Secondary | ICD-10-CM | POA: Diagnosis not present

## 2020-08-12 DIAGNOSIS — F70 Mild intellectual disabilities: Secondary | ICD-10-CM | POA: Diagnosis not present

## 2020-09-19 DIAGNOSIS — R109 Unspecified abdominal pain: Secondary | ICD-10-CM | POA: Diagnosis not present

## 2020-09-19 DIAGNOSIS — R197 Diarrhea, unspecified: Secondary | ICD-10-CM | POA: Diagnosis not present

## 2020-09-19 DIAGNOSIS — Z0001 Encounter for general adult medical examination with abnormal findings: Secondary | ICD-10-CM | POA: Diagnosis not present

## 2020-09-19 DIAGNOSIS — R300 Dysuria: Secondary | ICD-10-CM | POA: Diagnosis not present

## 2020-09-19 DIAGNOSIS — E785 Hyperlipidemia, unspecified: Secondary | ICD-10-CM | POA: Diagnosis not present

## 2020-09-19 DIAGNOSIS — E119 Type 2 diabetes mellitus without complications: Secondary | ICD-10-CM | POA: Diagnosis not present

## 2020-09-19 DIAGNOSIS — K219 Gastro-esophageal reflux disease without esophagitis: Secondary | ICD-10-CM | POA: Diagnosis not present

## 2020-09-19 DIAGNOSIS — E1169 Type 2 diabetes mellitus with other specified complication: Secondary | ICD-10-CM | POA: Diagnosis not present

## 2020-09-19 DIAGNOSIS — R32 Unspecified urinary incontinence: Secondary | ICD-10-CM | POA: Diagnosis not present

## 2020-09-19 DIAGNOSIS — E782 Mixed hyperlipidemia: Secondary | ICD-10-CM | POA: Diagnosis not present

## 2020-09-19 DIAGNOSIS — I10 Essential (primary) hypertension: Secondary | ICD-10-CM | POA: Diagnosis not present

## 2020-09-19 DIAGNOSIS — E875 Hyperkalemia: Secondary | ICD-10-CM | POA: Diagnosis not present

## 2020-09-23 DIAGNOSIS — E1169 Type 2 diabetes mellitus with other specified complication: Secondary | ICD-10-CM | POA: Diagnosis not present

## 2020-09-23 DIAGNOSIS — F209 Schizophrenia, unspecified: Secondary | ICD-10-CM | POA: Diagnosis not present

## 2020-09-23 DIAGNOSIS — E878 Other disorders of electrolyte and fluid balance, not elsewhere classified: Secondary | ICD-10-CM | POA: Diagnosis not present

## 2020-09-23 DIAGNOSIS — G40909 Epilepsy, unspecified, not intractable, without status epilepticus: Secondary | ICD-10-CM | POA: Diagnosis not present

## 2020-09-23 DIAGNOSIS — I10 Essential (primary) hypertension: Secondary | ICD-10-CM | POA: Diagnosis not present

## 2020-09-23 DIAGNOSIS — K219 Gastro-esophageal reflux disease without esophagitis: Secondary | ICD-10-CM | POA: Diagnosis not present

## 2020-09-23 DIAGNOSIS — E119 Type 2 diabetes mellitus without complications: Secondary | ICD-10-CM | POA: Diagnosis not present

## 2020-09-23 DIAGNOSIS — E782 Mixed hyperlipidemia: Secondary | ICD-10-CM | POA: Diagnosis not present

## 2020-09-24 DIAGNOSIS — Z23 Encounter for immunization: Secondary | ICD-10-CM | POA: Diagnosis not present

## 2020-10-01 DIAGNOSIS — B351 Tinea unguium: Secondary | ICD-10-CM | POA: Diagnosis not present

## 2020-10-01 DIAGNOSIS — L851 Acquired keratosis [keratoderma] palmaris et plantaris: Secondary | ICD-10-CM | POA: Diagnosis not present

## 2020-10-01 DIAGNOSIS — E1142 Type 2 diabetes mellitus with diabetic polyneuropathy: Secondary | ICD-10-CM | POA: Diagnosis not present

## 2020-10-24 DIAGNOSIS — Z23 Encounter for immunization: Secondary | ICD-10-CM | POA: Diagnosis not present

## 2020-10-24 DIAGNOSIS — S01111A Laceration without foreign body of right eyelid and periocular area, initial encounter: Secondary | ICD-10-CM | POA: Diagnosis not present

## 2020-10-25 DIAGNOSIS — M79675 Pain in left toe(s): Secondary | ICD-10-CM | POA: Diagnosis not present

## 2020-10-25 DIAGNOSIS — S91101A Unspecified open wound of right great toe without damage to nail, initial encounter: Secondary | ICD-10-CM | POA: Diagnosis not present

## 2020-10-25 DIAGNOSIS — L03032 Cellulitis of left toe: Secondary | ICD-10-CM | POA: Diagnosis not present

## 2020-11-05 DIAGNOSIS — Z23 Encounter for immunization: Secondary | ICD-10-CM | POA: Diagnosis not present

## 2020-11-08 DIAGNOSIS — L03032 Cellulitis of left toe: Secondary | ICD-10-CM | POA: Diagnosis not present

## 2020-11-12 DIAGNOSIS — F70 Mild intellectual disabilities: Secondary | ICD-10-CM | POA: Diagnosis not present

## 2020-12-10 DIAGNOSIS — B351 Tinea unguium: Secondary | ICD-10-CM | POA: Diagnosis not present

## 2020-12-10 DIAGNOSIS — E1142 Type 2 diabetes mellitus with diabetic polyneuropathy: Secondary | ICD-10-CM | POA: Diagnosis not present

## 2020-12-10 DIAGNOSIS — L851 Acquired keratosis [keratoderma] palmaris et plantaris: Secondary | ICD-10-CM | POA: Diagnosis not present

## 2021-01-15 DIAGNOSIS — E782 Mixed hyperlipidemia: Secondary | ICD-10-CM | POA: Diagnosis not present

## 2021-01-15 DIAGNOSIS — F209 Schizophrenia, unspecified: Secondary | ICD-10-CM | POA: Diagnosis not present

## 2021-01-15 DIAGNOSIS — I10 Essential (primary) hypertension: Secondary | ICD-10-CM | POA: Diagnosis not present

## 2021-01-15 DIAGNOSIS — G40909 Epilepsy, unspecified, not intractable, without status epilepticus: Secondary | ICD-10-CM | POA: Diagnosis not present

## 2021-01-15 DIAGNOSIS — E878 Other disorders of electrolyte and fluid balance, not elsewhere classified: Secondary | ICD-10-CM | POA: Diagnosis not present

## 2021-01-15 DIAGNOSIS — K219 Gastro-esophageal reflux disease without esophagitis: Secondary | ICD-10-CM | POA: Diagnosis not present

## 2021-01-15 DIAGNOSIS — E1169 Type 2 diabetes mellitus with other specified complication: Secondary | ICD-10-CM | POA: Diagnosis not present

## 2021-01-30 DIAGNOSIS — F209 Schizophrenia, unspecified: Secondary | ICD-10-CM | POA: Diagnosis not present

## 2021-01-30 DIAGNOSIS — I10 Essential (primary) hypertension: Secondary | ICD-10-CM | POA: Diagnosis not present

## 2021-01-30 DIAGNOSIS — F09 Unspecified mental disorder due to known physiological condition: Secondary | ICD-10-CM | POA: Diagnosis not present

## 2021-01-30 DIAGNOSIS — E119 Type 2 diabetes mellitus without complications: Secondary | ICD-10-CM | POA: Diagnosis not present

## 2021-01-30 DIAGNOSIS — G40909 Epilepsy, unspecified, not intractable, without status epilepticus: Secondary | ICD-10-CM | POA: Diagnosis not present

## 2021-01-30 DIAGNOSIS — F5101 Primary insomnia: Secondary | ICD-10-CM | POA: Diagnosis not present

## 2021-01-30 DIAGNOSIS — K219 Gastro-esophageal reflux disease without esophagitis: Secondary | ICD-10-CM | POA: Diagnosis not present

## 2021-01-30 DIAGNOSIS — R109 Unspecified abdominal pain: Secondary | ICD-10-CM | POA: Diagnosis not present

## 2021-01-30 DIAGNOSIS — E1169 Type 2 diabetes mellitus with other specified complication: Secondary | ICD-10-CM | POA: Diagnosis not present

## 2021-01-30 DIAGNOSIS — G3184 Mild cognitive impairment, so stated: Secondary | ICD-10-CM | POA: Diagnosis not present

## 2021-01-30 DIAGNOSIS — E782 Mixed hyperlipidemia: Secondary | ICD-10-CM | POA: Diagnosis not present

## 2021-01-30 DIAGNOSIS — E875 Hyperkalemia: Secondary | ICD-10-CM | POA: Diagnosis not present

## 2021-02-04 DIAGNOSIS — E1169 Type 2 diabetes mellitus with other specified complication: Secondary | ICD-10-CM | POA: Diagnosis not present

## 2021-02-04 DIAGNOSIS — K219 Gastro-esophageal reflux disease without esophagitis: Secondary | ICD-10-CM | POA: Diagnosis not present

## 2021-02-04 DIAGNOSIS — E782 Mixed hyperlipidemia: Secondary | ICD-10-CM | POA: Diagnosis not present

## 2021-02-04 DIAGNOSIS — I10 Essential (primary) hypertension: Secondary | ICD-10-CM | POA: Diagnosis not present

## 2021-02-04 DIAGNOSIS — G40909 Epilepsy, unspecified, not intractable, without status epilepticus: Secondary | ICD-10-CM | POA: Diagnosis not present

## 2021-02-04 DIAGNOSIS — F209 Schizophrenia, unspecified: Secondary | ICD-10-CM | POA: Diagnosis not present

## 2021-02-04 DIAGNOSIS — E878 Other disorders of electrolyte and fluid balance, not elsewhere classified: Secondary | ICD-10-CM | POA: Diagnosis not present

## 2021-02-14 DIAGNOSIS — M19071 Primary osteoarthritis, right ankle and foot: Secondary | ICD-10-CM | POA: Diagnosis not present

## 2021-02-18 DIAGNOSIS — E1142 Type 2 diabetes mellitus with diabetic polyneuropathy: Secondary | ICD-10-CM | POA: Diagnosis not present

## 2021-02-18 DIAGNOSIS — B351 Tinea unguium: Secondary | ICD-10-CM | POA: Diagnosis not present

## 2021-02-18 DIAGNOSIS — L84 Corns and callosities: Secondary | ICD-10-CM | POA: Diagnosis not present

## 2021-03-20 DIAGNOSIS — F70 Mild intellectual disabilities: Secondary | ICD-10-CM | POA: Diagnosis not present

## 2021-03-28 DIAGNOSIS — F0390 Unspecified dementia without behavioral disturbance: Secondary | ICD-10-CM | POA: Diagnosis not present

## 2021-03-28 DIAGNOSIS — G3184 Mild cognitive impairment, so stated: Secondary | ICD-10-CM | POA: Diagnosis not present

## 2021-04-29 DIAGNOSIS — L84 Corns and callosities: Secondary | ICD-10-CM | POA: Diagnosis not present

## 2021-04-29 DIAGNOSIS — E1142 Type 2 diabetes mellitus with diabetic polyneuropathy: Secondary | ICD-10-CM | POA: Diagnosis not present

## 2021-04-29 DIAGNOSIS — B351 Tinea unguium: Secondary | ICD-10-CM | POA: Diagnosis not present

## 2021-05-01 DIAGNOSIS — I10 Essential (primary) hypertension: Secondary | ICD-10-CM | POA: Diagnosis not present

## 2021-05-01 DIAGNOSIS — E785 Hyperlipidemia, unspecified: Secondary | ICD-10-CM | POA: Diagnosis not present

## 2021-05-01 DIAGNOSIS — G40909 Epilepsy, unspecified, not intractable, without status epilepticus: Secondary | ICD-10-CM | POA: Diagnosis not present

## 2021-05-01 DIAGNOSIS — F209 Schizophrenia, unspecified: Secondary | ICD-10-CM | POA: Diagnosis not present

## 2021-05-01 DIAGNOSIS — N401 Enlarged prostate with lower urinary tract symptoms: Secondary | ICD-10-CM | POA: Diagnosis not present

## 2021-05-01 DIAGNOSIS — J309 Allergic rhinitis, unspecified: Secondary | ICD-10-CM | POA: Diagnosis not present

## 2021-06-17 DIAGNOSIS — F70 Mild intellectual disabilities: Secondary | ICD-10-CM | POA: Diagnosis not present

## 2021-07-08 DIAGNOSIS — B351 Tinea unguium: Secondary | ICD-10-CM | POA: Diagnosis not present

## 2021-07-08 DIAGNOSIS — E1142 Type 2 diabetes mellitus with diabetic polyneuropathy: Secondary | ICD-10-CM | POA: Diagnosis not present

## 2021-07-08 DIAGNOSIS — L84 Corns and callosities: Secondary | ICD-10-CM | POA: Diagnosis not present

## 2021-07-31 DIAGNOSIS — N400 Enlarged prostate without lower urinary tract symptoms: Secondary | ICD-10-CM | POA: Diagnosis not present

## 2021-07-31 DIAGNOSIS — I10 Essential (primary) hypertension: Secondary | ICD-10-CM | POA: Diagnosis not present

## 2021-08-12 DIAGNOSIS — F70 Mild intellectual disabilities: Secondary | ICD-10-CM | POA: Diagnosis not present

## 2021-08-22 DIAGNOSIS — F209 Schizophrenia, unspecified: Secondary | ICD-10-CM | POA: Diagnosis not present

## 2021-08-22 DIAGNOSIS — E875 Hyperkalemia: Secondary | ICD-10-CM | POA: Diagnosis not present

## 2021-08-22 DIAGNOSIS — E871 Hypo-osmolality and hyponatremia: Secondary | ICD-10-CM | POA: Diagnosis not present

## 2021-08-22 DIAGNOSIS — E1169 Type 2 diabetes mellitus with other specified complication: Secondary | ICD-10-CM | POA: Diagnosis not present

## 2021-08-22 DIAGNOSIS — I10 Essential (primary) hypertension: Secondary | ICD-10-CM | POA: Diagnosis not present

## 2021-08-22 DIAGNOSIS — E878 Other disorders of electrolyte and fluid balance, not elsewhere classified: Secondary | ICD-10-CM | POA: Diagnosis not present

## 2021-08-22 DIAGNOSIS — G40909 Epilepsy, unspecified, not intractable, without status epilepticus: Secondary | ICD-10-CM | POA: Diagnosis not present

## 2021-08-22 DIAGNOSIS — E782 Mixed hyperlipidemia: Secondary | ICD-10-CM | POA: Diagnosis not present

## 2021-08-22 DIAGNOSIS — K219 Gastro-esophageal reflux disease without esophagitis: Secondary | ICD-10-CM | POA: Diagnosis not present

## 2021-08-22 DIAGNOSIS — Z0001 Encounter for general adult medical examination with abnormal findings: Secondary | ICD-10-CM | POA: Diagnosis not present

## 2021-09-23 DIAGNOSIS — Z79899 Other long term (current) drug therapy: Secondary | ICD-10-CM | POA: Diagnosis not present

## 2021-09-30 DIAGNOSIS — E1142 Type 2 diabetes mellitus with diabetic polyneuropathy: Secondary | ICD-10-CM | POA: Diagnosis not present

## 2021-09-30 DIAGNOSIS — L84 Corns and callosities: Secondary | ICD-10-CM | POA: Diagnosis not present

## 2021-09-30 DIAGNOSIS — B351 Tinea unguium: Secondary | ICD-10-CM | POA: Diagnosis not present

## 2021-10-30 DIAGNOSIS — Z23 Encounter for immunization: Secondary | ICD-10-CM | POA: Diagnosis not present

## 2021-10-31 DIAGNOSIS — U071 COVID-19: Secondary | ICD-10-CM | POA: Diagnosis not present

## 2021-11-07 DIAGNOSIS — F70 Mild intellectual disabilities: Secondary | ICD-10-CM | POA: Diagnosis not present

## 2021-12-19 DIAGNOSIS — E1142 Type 2 diabetes mellitus with diabetic polyneuropathy: Secondary | ICD-10-CM | POA: Diagnosis not present

## 2021-12-19 DIAGNOSIS — B351 Tinea unguium: Secondary | ICD-10-CM | POA: Diagnosis not present

## 2021-12-19 DIAGNOSIS — L84 Corns and callosities: Secondary | ICD-10-CM | POA: Diagnosis not present

## 2022-01-01 DIAGNOSIS — K219 Gastro-esophageal reflux disease without esophagitis: Secondary | ICD-10-CM | POA: Diagnosis not present

## 2022-01-01 DIAGNOSIS — E782 Mixed hyperlipidemia: Secondary | ICD-10-CM | POA: Diagnosis not present

## 2022-01-01 DIAGNOSIS — E871 Hypo-osmolality and hyponatremia: Secondary | ICD-10-CM | POA: Diagnosis not present

## 2022-01-01 DIAGNOSIS — E875 Hyperkalemia: Secondary | ICD-10-CM | POA: Diagnosis not present

## 2022-01-01 DIAGNOSIS — I10 Essential (primary) hypertension: Secondary | ICD-10-CM | POA: Diagnosis not present

## 2022-01-01 DIAGNOSIS — E878 Other disorders of electrolyte and fluid balance, not elsewhere classified: Secondary | ICD-10-CM | POA: Diagnosis not present

## 2022-01-01 DIAGNOSIS — E1169 Type 2 diabetes mellitus with other specified complication: Secondary | ICD-10-CM | POA: Diagnosis not present

## 2022-02-20 DIAGNOSIS — E782 Mixed hyperlipidemia: Secondary | ICD-10-CM | POA: Diagnosis not present

## 2022-02-20 DIAGNOSIS — E1169 Type 2 diabetes mellitus with other specified complication: Secondary | ICD-10-CM | POA: Diagnosis not present

## 2022-02-23 DIAGNOSIS — E875 Hyperkalemia: Secondary | ICD-10-CM | POA: Diagnosis not present

## 2022-02-23 DIAGNOSIS — E782 Mixed hyperlipidemia: Secondary | ICD-10-CM | POA: Diagnosis not present

## 2022-02-23 DIAGNOSIS — I10 Essential (primary) hypertension: Secondary | ICD-10-CM | POA: Diagnosis not present

## 2022-02-23 DIAGNOSIS — E878 Other disorders of electrolyte and fluid balance, not elsewhere classified: Secondary | ICD-10-CM | POA: Diagnosis not present

## 2022-02-23 DIAGNOSIS — K219 Gastro-esophageal reflux disease without esophagitis: Secondary | ICD-10-CM | POA: Diagnosis not present

## 2022-02-23 DIAGNOSIS — E1169 Type 2 diabetes mellitus with other specified complication: Secondary | ICD-10-CM | POA: Diagnosis not present

## 2022-02-23 DIAGNOSIS — E871 Hypo-osmolality and hyponatremia: Secondary | ICD-10-CM | POA: Diagnosis not present

## 2022-06-26 DIAGNOSIS — Z79899 Other long term (current) drug therapy: Secondary | ICD-10-CM | POA: Diagnosis not present

## 2022-07-08 DIAGNOSIS — Z79899 Other long term (current) drug therapy: Secondary | ICD-10-CM | POA: Diagnosis not present

## 2022-08-18 DIAGNOSIS — E1169 Type 2 diabetes mellitus with other specified complication: Secondary | ICD-10-CM | POA: Diagnosis not present

## 2022-08-18 DIAGNOSIS — E782 Mixed hyperlipidemia: Secondary | ICD-10-CM | POA: Diagnosis not present

## 2022-08-25 DIAGNOSIS — I10 Essential (primary) hypertension: Secondary | ICD-10-CM | POA: Diagnosis not present

## 2022-08-25 DIAGNOSIS — M79642 Pain in left hand: Secondary | ICD-10-CM | POA: Diagnosis not present

## 2022-08-25 DIAGNOSIS — K219 Gastro-esophageal reflux disease without esophagitis: Secondary | ICD-10-CM | POA: Diagnosis not present

## 2022-08-25 DIAGNOSIS — E871 Hypo-osmolality and hyponatremia: Secondary | ICD-10-CM | POA: Diagnosis not present

## 2022-08-25 DIAGNOSIS — Q845 Enlarged and hypertrophic nails: Secondary | ICD-10-CM | POA: Diagnosis not present

## 2022-08-25 DIAGNOSIS — E875 Hyperkalemia: Secondary | ICD-10-CM | POA: Diagnosis not present

## 2022-08-25 DIAGNOSIS — E878 Other disorders of electrolyte and fluid balance, not elsewhere classified: Secondary | ICD-10-CM | POA: Diagnosis not present

## 2022-08-25 DIAGNOSIS — M79641 Pain in right hand: Secondary | ICD-10-CM | POA: Diagnosis not present

## 2022-08-25 DIAGNOSIS — E782 Mixed hyperlipidemia: Secondary | ICD-10-CM | POA: Diagnosis not present

## 2022-08-25 DIAGNOSIS — E118 Type 2 diabetes mellitus with unspecified complications: Secondary | ICD-10-CM | POA: Diagnosis not present

## 2022-09-15 DIAGNOSIS — I739 Peripheral vascular disease, unspecified: Secondary | ICD-10-CM | POA: Diagnosis not present

## 2022-09-15 DIAGNOSIS — M79674 Pain in right toe(s): Secondary | ICD-10-CM | POA: Diagnosis not present

## 2022-09-15 DIAGNOSIS — E1140 Type 2 diabetes mellitus with diabetic neuropathy, unspecified: Secondary | ICD-10-CM | POA: Diagnosis not present

## 2022-09-15 DIAGNOSIS — M79671 Pain in right foot: Secondary | ICD-10-CM | POA: Diagnosis not present

## 2022-09-15 DIAGNOSIS — M79675 Pain in left toe(s): Secondary | ICD-10-CM | POA: Diagnosis not present

## 2022-09-15 DIAGNOSIS — M79672 Pain in left foot: Secondary | ICD-10-CM | POA: Diagnosis not present

## 2022-10-23 DIAGNOSIS — Z23 Encounter for immunization: Secondary | ICD-10-CM | POA: Diagnosis not present

## 2022-12-08 DIAGNOSIS — I739 Peripheral vascular disease, unspecified: Secondary | ICD-10-CM | POA: Diagnosis not present

## 2022-12-08 DIAGNOSIS — E1140 Type 2 diabetes mellitus with diabetic neuropathy, unspecified: Secondary | ICD-10-CM | POA: Diagnosis not present

## 2022-12-08 DIAGNOSIS — M79674 Pain in right toe(s): Secondary | ICD-10-CM | POA: Diagnosis not present

## 2022-12-08 DIAGNOSIS — M79672 Pain in left foot: Secondary | ICD-10-CM | POA: Diagnosis not present

## 2022-12-08 DIAGNOSIS — L110 Acquired keratosis follicularis: Secondary | ICD-10-CM | POA: Diagnosis not present

## 2022-12-08 DIAGNOSIS — M79675 Pain in left toe(s): Secondary | ICD-10-CM | POA: Diagnosis not present

## 2022-12-08 DIAGNOSIS — M79671 Pain in right foot: Secondary | ICD-10-CM | POA: Diagnosis not present

## 2022-12-24 DIAGNOSIS — E118 Type 2 diabetes mellitus with unspecified complications: Secondary | ICD-10-CM | POA: Diagnosis not present

## 2022-12-24 DIAGNOSIS — I10 Essential (primary) hypertension: Secondary | ICD-10-CM | POA: Diagnosis not present

## 2022-12-24 DIAGNOSIS — M79642 Pain in left hand: Secondary | ICD-10-CM | POA: Diagnosis not present

## 2022-12-24 DIAGNOSIS — E782 Mixed hyperlipidemia: Secondary | ICD-10-CM | POA: Diagnosis not present

## 2022-12-24 DIAGNOSIS — M79641 Pain in right hand: Secondary | ICD-10-CM | POA: Diagnosis not present

## 2022-12-24 DIAGNOSIS — R7889 Finding of other specified substances, not normally found in blood: Secondary | ICD-10-CM | POA: Diagnosis not present

## 2022-12-24 DIAGNOSIS — L602 Onychogryphosis: Secondary | ICD-10-CM | POA: Diagnosis not present

## 2022-12-24 DIAGNOSIS — E871 Hypo-osmolality and hyponatremia: Secondary | ICD-10-CM | POA: Diagnosis not present

## 2022-12-24 DIAGNOSIS — K219 Gastro-esophageal reflux disease without esophagitis: Secondary | ICD-10-CM | POA: Diagnosis not present

## 2022-12-24 DIAGNOSIS — E875 Hyperkalemia: Secondary | ICD-10-CM | POA: Diagnosis not present

## 2023-01-14 DIAGNOSIS — S91209A Unspecified open wound of unspecified toe(s) with damage to nail, initial encounter: Secondary | ICD-10-CM | POA: Diagnosis not present

## 2023-01-18 DIAGNOSIS — L03031 Cellulitis of right toe: Secondary | ICD-10-CM | POA: Diagnosis not present

## 2023-01-18 DIAGNOSIS — M79674 Pain in right toe(s): Secondary | ICD-10-CM | POA: Diagnosis not present

## 2023-01-18 DIAGNOSIS — L600 Ingrowing nail: Secondary | ICD-10-CM | POA: Diagnosis not present

## 2023-01-18 DIAGNOSIS — M79671 Pain in right foot: Secondary | ICD-10-CM | POA: Diagnosis not present

## 2023-01-29 DIAGNOSIS — H524 Presbyopia: Secondary | ICD-10-CM | POA: Diagnosis not present

## 2023-01-29 DIAGNOSIS — H5213 Myopia, bilateral: Secondary | ICD-10-CM | POA: Diagnosis not present

## 2023-01-29 DIAGNOSIS — Z7984 Long term (current) use of oral hypoglycemic drugs: Secondary | ICD-10-CM | POA: Diagnosis not present

## 2023-02-01 DIAGNOSIS — M79674 Pain in right toe(s): Secondary | ICD-10-CM | POA: Diagnosis not present

## 2023-02-01 DIAGNOSIS — L03031 Cellulitis of right toe: Secondary | ICD-10-CM | POA: Diagnosis not present

## 2023-02-01 DIAGNOSIS — M79671 Pain in right foot: Secondary | ICD-10-CM | POA: Diagnosis not present

## 2023-02-09 DIAGNOSIS — M79601 Pain in right arm: Secondary | ICD-10-CM | POA: Diagnosis not present

## 2023-02-18 DIAGNOSIS — E118 Type 2 diabetes mellitus with unspecified complications: Secondary | ICD-10-CM | POA: Diagnosis not present

## 2023-02-18 DIAGNOSIS — E782 Mixed hyperlipidemia: Secondary | ICD-10-CM | POA: Diagnosis not present

## 2023-03-03 DIAGNOSIS — E1169 Type 2 diabetes mellitus with other specified complication: Secondary | ICD-10-CM | POA: Diagnosis not present

## 2023-03-03 DIAGNOSIS — E878 Other disorders of electrolyte and fluid balance, not elsewhere classified: Secondary | ICD-10-CM | POA: Diagnosis not present

## 2023-03-03 DIAGNOSIS — E118 Type 2 diabetes mellitus with unspecified complications: Secondary | ICD-10-CM | POA: Diagnosis not present

## 2023-03-03 DIAGNOSIS — E875 Hyperkalemia: Secondary | ICD-10-CM | POA: Diagnosis not present

## 2023-03-03 DIAGNOSIS — K219 Gastro-esophageal reflux disease without esophagitis: Secondary | ICD-10-CM | POA: Diagnosis not present

## 2023-03-03 DIAGNOSIS — Z79899 Other long term (current) drug therapy: Secondary | ICD-10-CM | POA: Diagnosis not present

## 2023-03-03 DIAGNOSIS — Z0001 Encounter for general adult medical examination with abnormal findings: Secondary | ICD-10-CM | POA: Diagnosis not present

## 2023-03-03 DIAGNOSIS — L602 Onychogryphosis: Secondary | ICD-10-CM | POA: Diagnosis not present

## 2023-03-03 DIAGNOSIS — E782 Mixed hyperlipidemia: Secondary | ICD-10-CM | POA: Diagnosis not present

## 2023-03-03 DIAGNOSIS — E871 Hypo-osmolality and hyponatremia: Secondary | ICD-10-CM | POA: Diagnosis not present

## 2023-03-03 DIAGNOSIS — I10 Essential (primary) hypertension: Secondary | ICD-10-CM | POA: Diagnosis not present

## 2023-03-08 DIAGNOSIS — E7849 Other hyperlipidemia: Secondary | ICD-10-CM | POA: Diagnosis not present

## 2023-03-08 DIAGNOSIS — J301 Allergic rhinitis due to pollen: Secondary | ICD-10-CM | POA: Diagnosis not present

## 2023-03-08 DIAGNOSIS — I10 Essential (primary) hypertension: Secondary | ICD-10-CM | POA: Diagnosis not present

## 2023-03-08 DIAGNOSIS — Z6824 Body mass index (BMI) 24.0-24.9, adult: Secondary | ICD-10-CM | POA: Diagnosis not present

## 2023-03-08 DIAGNOSIS — R569 Unspecified convulsions: Secondary | ICD-10-CM | POA: Diagnosis not present

## 2023-03-08 DIAGNOSIS — K2100 Gastro-esophageal reflux disease with esophagitis, without bleeding: Secondary | ICD-10-CM | POA: Diagnosis not present

## 2023-03-09 DIAGNOSIS — L110 Acquired keratosis follicularis: Secondary | ICD-10-CM | POA: Diagnosis not present

## 2023-03-09 DIAGNOSIS — M79674 Pain in right toe(s): Secondary | ICD-10-CM | POA: Diagnosis not present

## 2023-03-09 DIAGNOSIS — I739 Peripheral vascular disease, unspecified: Secondary | ICD-10-CM | POA: Diagnosis not present

## 2023-03-09 DIAGNOSIS — E1140 Type 2 diabetes mellitus with diabetic neuropathy, unspecified: Secondary | ICD-10-CM | POA: Diagnosis not present

## 2023-03-09 DIAGNOSIS — M79675 Pain in left toe(s): Secondary | ICD-10-CM | POA: Diagnosis not present

## 2023-03-09 DIAGNOSIS — M79672 Pain in left foot: Secondary | ICD-10-CM | POA: Diagnosis not present

## 2023-03-09 DIAGNOSIS — M79671 Pain in right foot: Secondary | ICD-10-CM | POA: Diagnosis not present

## 2023-05-25 DIAGNOSIS — M79672 Pain in left foot: Secondary | ICD-10-CM | POA: Diagnosis not present

## 2023-05-25 DIAGNOSIS — E1140 Type 2 diabetes mellitus with diabetic neuropathy, unspecified: Secondary | ICD-10-CM | POA: Diagnosis not present

## 2023-05-25 DIAGNOSIS — L110 Acquired keratosis follicularis: Secondary | ICD-10-CM | POA: Diagnosis not present

## 2023-05-25 DIAGNOSIS — M79671 Pain in right foot: Secondary | ICD-10-CM | POA: Diagnosis not present

## 2023-05-25 DIAGNOSIS — I739 Peripheral vascular disease, unspecified: Secondary | ICD-10-CM | POA: Diagnosis not present

## 2023-05-25 DIAGNOSIS — M79674 Pain in right toe(s): Secondary | ICD-10-CM | POA: Diagnosis not present

## 2023-05-25 DIAGNOSIS — M79675 Pain in left toe(s): Secondary | ICD-10-CM | POA: Diagnosis not present

## 2023-06-17 DIAGNOSIS — K2100 Gastro-esophageal reflux disease with esophagitis, without bleeding: Secondary | ICD-10-CM | POA: Diagnosis not present

## 2023-06-17 DIAGNOSIS — E039 Hypothyroidism, unspecified: Secondary | ICD-10-CM | POA: Diagnosis not present

## 2023-06-17 DIAGNOSIS — E7849 Other hyperlipidemia: Secondary | ICD-10-CM | POA: Diagnosis not present

## 2023-06-17 DIAGNOSIS — I10 Essential (primary) hypertension: Secondary | ICD-10-CM | POA: Diagnosis not present

## 2023-06-17 DIAGNOSIS — R569 Unspecified convulsions: Secondary | ICD-10-CM | POA: Diagnosis not present

## 2023-06-21 DIAGNOSIS — J301 Allergic rhinitis due to pollen: Secondary | ICD-10-CM | POA: Diagnosis not present

## 2023-06-21 DIAGNOSIS — R739 Hyperglycemia, unspecified: Secondary | ICD-10-CM | POA: Diagnosis not present

## 2023-06-21 DIAGNOSIS — K2100 Gastro-esophageal reflux disease with esophagitis, without bleeding: Secondary | ICD-10-CM | POA: Diagnosis not present

## 2023-06-21 DIAGNOSIS — Z6824 Body mass index (BMI) 24.0-24.9, adult: Secondary | ICD-10-CM | POA: Diagnosis not present

## 2023-06-21 DIAGNOSIS — E7849 Other hyperlipidemia: Secondary | ICD-10-CM | POA: Diagnosis not present

## 2023-06-21 DIAGNOSIS — I10 Essential (primary) hypertension: Secondary | ICD-10-CM | POA: Diagnosis not present

## 2023-06-21 DIAGNOSIS — R569 Unspecified convulsions: Secondary | ICD-10-CM | POA: Diagnosis not present

## 2023-06-21 DIAGNOSIS — Z23 Encounter for immunization: Secondary | ICD-10-CM | POA: Diagnosis not present

## 2023-06-21 DIAGNOSIS — Z0001 Encounter for general adult medical examination with abnormal findings: Secondary | ICD-10-CM | POA: Diagnosis not present

## 2023-07-29 DIAGNOSIS — H25813 Combined forms of age-related cataract, bilateral: Secondary | ICD-10-CM | POA: Diagnosis not present

## 2023-09-09 DIAGNOSIS — E1140 Type 2 diabetes mellitus with diabetic neuropathy, unspecified: Secondary | ICD-10-CM | POA: Diagnosis not present

## 2023-09-09 DIAGNOSIS — I739 Peripheral vascular disease, unspecified: Secondary | ICD-10-CM | POA: Diagnosis not present

## 2023-09-09 DIAGNOSIS — M79674 Pain in right toe(s): Secondary | ICD-10-CM | POA: Diagnosis not present

## 2023-09-09 DIAGNOSIS — M79675 Pain in left toe(s): Secondary | ICD-10-CM | POA: Diagnosis not present

## 2023-09-09 DIAGNOSIS — M79672 Pain in left foot: Secondary | ICD-10-CM | POA: Diagnosis not present

## 2023-09-09 DIAGNOSIS — M79671 Pain in right foot: Secondary | ICD-10-CM | POA: Diagnosis not present

## 2023-09-09 DIAGNOSIS — L110 Acquired keratosis follicularis: Secondary | ICD-10-CM | POA: Diagnosis not present

## 2023-11-04 DIAGNOSIS — R569 Unspecified convulsions: Secondary | ICD-10-CM | POA: Diagnosis not present

## 2023-11-04 DIAGNOSIS — R739 Hyperglycemia, unspecified: Secondary | ICD-10-CM | POA: Diagnosis not present

## 2023-11-04 DIAGNOSIS — K2100 Gastro-esophageal reflux disease with esophagitis, without bleeding: Secondary | ICD-10-CM | POA: Diagnosis not present

## 2023-11-04 DIAGNOSIS — J301 Allergic rhinitis due to pollen: Secondary | ICD-10-CM | POA: Diagnosis not present

## 2023-11-04 DIAGNOSIS — Z0001 Encounter for general adult medical examination with abnormal findings: Secondary | ICD-10-CM | POA: Diagnosis not present

## 2023-11-04 DIAGNOSIS — I10 Essential (primary) hypertension: Secondary | ICD-10-CM | POA: Diagnosis not present

## 2023-11-04 DIAGNOSIS — E7849 Other hyperlipidemia: Secondary | ICD-10-CM | POA: Diagnosis not present

## 2023-12-07 DIAGNOSIS — M79675 Pain in left toe(s): Secondary | ICD-10-CM | POA: Diagnosis not present

## 2023-12-07 DIAGNOSIS — M79674 Pain in right toe(s): Secondary | ICD-10-CM | POA: Diagnosis not present

## 2023-12-07 DIAGNOSIS — L110 Acquired keratosis follicularis: Secondary | ICD-10-CM | POA: Diagnosis not present

## 2023-12-07 DIAGNOSIS — I739 Peripheral vascular disease, unspecified: Secondary | ICD-10-CM | POA: Diagnosis not present

## 2023-12-07 DIAGNOSIS — E1140 Type 2 diabetes mellitus with diabetic neuropathy, unspecified: Secondary | ICD-10-CM | POA: Diagnosis not present

## 2023-12-07 DIAGNOSIS — M79671 Pain in right foot: Secondary | ICD-10-CM | POA: Diagnosis not present

## 2023-12-07 DIAGNOSIS — M79672 Pain in left foot: Secondary | ICD-10-CM | POA: Diagnosis not present

## 2023-12-30 DIAGNOSIS — Z6824 Body mass index (BMI) 24.0-24.9, adult: Secondary | ICD-10-CM | POA: Diagnosis not present

## 2023-12-30 DIAGNOSIS — J069 Acute upper respiratory infection, unspecified: Secondary | ICD-10-CM | POA: Diagnosis not present

## 2024-01-25 DIAGNOSIS — Z743 Need for continuous supervision: Secondary | ICD-10-CM | POA: Diagnosis not present

## 2024-01-25 DIAGNOSIS — I10 Essential (primary) hypertension: Secondary | ICD-10-CM | POA: Diagnosis not present

## 2024-01-25 DIAGNOSIS — R6889 Other general symptoms and signs: Secondary | ICD-10-CM | POA: Diagnosis not present

## 2024-01-25 DIAGNOSIS — R5381 Other malaise: Secondary | ICD-10-CM | POA: Diagnosis not present

## 2024-01-25 DIAGNOSIS — E119 Type 2 diabetes mellitus without complications: Secondary | ICD-10-CM | POA: Diagnosis not present

## 2024-01-25 DIAGNOSIS — Z79899 Other long term (current) drug therapy: Secondary | ICD-10-CM | POA: Diagnosis not present

## 2024-02-01 DIAGNOSIS — I10 Essential (primary) hypertension: Secondary | ICD-10-CM | POA: Diagnosis not present

## 2024-03-02 DIAGNOSIS — I10 Essential (primary) hypertension: Secondary | ICD-10-CM | POA: Diagnosis not present

## 2024-03-02 DIAGNOSIS — E7849 Other hyperlipidemia: Secondary | ICD-10-CM | POA: Diagnosis not present

## 2024-03-02 DIAGNOSIS — E1165 Type 2 diabetes mellitus with hyperglycemia: Secondary | ICD-10-CM | POA: Diagnosis not present

## 2024-03-07 DIAGNOSIS — M79674 Pain in right toe(s): Secondary | ICD-10-CM | POA: Diagnosis not present

## 2024-03-07 DIAGNOSIS — I739 Peripheral vascular disease, unspecified: Secondary | ICD-10-CM | POA: Diagnosis not present

## 2024-03-07 DIAGNOSIS — E1140 Type 2 diabetes mellitus with diabetic neuropathy, unspecified: Secondary | ICD-10-CM | POA: Diagnosis not present

## 2024-03-07 DIAGNOSIS — L110 Acquired keratosis follicularis: Secondary | ICD-10-CM | POA: Diagnosis not present

## 2024-03-07 DIAGNOSIS — M79675 Pain in left toe(s): Secondary | ICD-10-CM | POA: Diagnosis not present

## 2024-03-07 DIAGNOSIS — M79671 Pain in right foot: Secondary | ICD-10-CM | POA: Diagnosis not present

## 2024-03-07 DIAGNOSIS — M79672 Pain in left foot: Secondary | ICD-10-CM | POA: Diagnosis not present

## 2024-03-08 DIAGNOSIS — E782 Mixed hyperlipidemia: Secondary | ICD-10-CM | POA: Diagnosis not present

## 2024-03-08 DIAGNOSIS — E7849 Other hyperlipidemia: Secondary | ICD-10-CM | POA: Diagnosis not present

## 2024-03-08 DIAGNOSIS — Z6824 Body mass index (BMI) 24.0-24.9, adult: Secondary | ICD-10-CM | POA: Diagnosis not present

## 2024-03-08 DIAGNOSIS — K2100 Gastro-esophageal reflux disease with esophagitis, without bleeding: Secondary | ICD-10-CM | POA: Diagnosis not present

## 2024-03-08 DIAGNOSIS — I10 Essential (primary) hypertension: Secondary | ICD-10-CM | POA: Diagnosis not present

## 2024-03-08 DIAGNOSIS — E1165 Type 2 diabetes mellitus with hyperglycemia: Secondary | ICD-10-CM | POA: Diagnosis not present

## 2024-06-06 DIAGNOSIS — L110 Acquired keratosis follicularis: Secondary | ICD-10-CM | POA: Diagnosis not present

## 2024-06-06 DIAGNOSIS — E1140 Type 2 diabetes mellitus with diabetic neuropathy, unspecified: Secondary | ICD-10-CM | POA: Diagnosis not present

## 2024-06-06 DIAGNOSIS — M79675 Pain in left toe(s): Secondary | ICD-10-CM | POA: Diagnosis not present

## 2024-06-06 DIAGNOSIS — M79672 Pain in left foot: Secondary | ICD-10-CM | POA: Diagnosis not present

## 2024-06-06 DIAGNOSIS — M79671 Pain in right foot: Secondary | ICD-10-CM | POA: Diagnosis not present

## 2024-06-06 DIAGNOSIS — M79674 Pain in right toe(s): Secondary | ICD-10-CM | POA: Diagnosis not present

## 2024-06-06 DIAGNOSIS — I739 Peripheral vascular disease, unspecified: Secondary | ICD-10-CM | POA: Diagnosis not present

## 2024-06-09 DIAGNOSIS — K219 Gastro-esophageal reflux disease without esophagitis: Secondary | ICD-10-CM | POA: Diagnosis not present

## 2024-06-09 DIAGNOSIS — R12 Heartburn: Secondary | ICD-10-CM | POA: Diagnosis not present

## 2024-06-09 DIAGNOSIS — I10 Essential (primary) hypertension: Secondary | ICD-10-CM | POA: Diagnosis not present

## 2024-06-26 DIAGNOSIS — K5900 Constipation, unspecified: Secondary | ICD-10-CM | POA: Diagnosis not present

## 2024-06-26 DIAGNOSIS — K602 Anal fissure, unspecified: Secondary | ICD-10-CM | POA: Diagnosis not present

## 2024-06-30 ENCOUNTER — Emergency Department (HOSPITAL_COMMUNITY)

## 2024-06-30 ENCOUNTER — Emergency Department (HOSPITAL_COMMUNITY)
Admission: EM | Admit: 2024-06-30 | Discharge: 2024-06-30 | Disposition: A | Discharge status: Home / Self Care | Attending: Emergency Medicine | Admitting: Emergency Medicine

## 2024-06-30 ENCOUNTER — Encounter (HOSPITAL_COMMUNITY): Payer: Self-pay

## 2024-06-30 ENCOUNTER — Other Ambulatory Visit: Payer: Self-pay

## 2024-06-30 DIAGNOSIS — S53105A Unspecified dislocation of left ulnohumeral joint, initial encounter: Secondary | ICD-10-CM | POA: Diagnosis not present

## 2024-06-30 DIAGNOSIS — S53115A Anterior dislocation of left ulnohumeral joint, initial encounter: Secondary | ICD-10-CM

## 2024-06-30 DIAGNOSIS — S53104A Unspecified dislocation of right ulnohumeral joint, initial encounter: Secondary | ICD-10-CM | POA: Diagnosis not present

## 2024-06-30 DIAGNOSIS — S53015A Anterior dislocation of left radial head, initial encounter: Secondary | ICD-10-CM | POA: Diagnosis not present

## 2024-06-30 DIAGNOSIS — R609 Edema, unspecified: Secondary | ICD-10-CM | POA: Diagnosis not present

## 2024-06-30 DIAGNOSIS — S53122A Posterior subluxation of left ulnohumeral joint, initial encounter: Secondary | ICD-10-CM | POA: Diagnosis not present

## 2024-06-30 DIAGNOSIS — Y92199 Unspecified place in other specified residential institution as the place of occurrence of the external cause: Secondary | ICD-10-CM | POA: Diagnosis not present

## 2024-06-30 DIAGNOSIS — S59902A Unspecified injury of left elbow, initial encounter: Secondary | ICD-10-CM | POA: Diagnosis present

## 2024-06-30 DIAGNOSIS — W01198A Fall on same level from slipping, tripping and stumbling with subsequent striking against other object, initial encounter: Secondary | ICD-10-CM | POA: Insufficient documentation

## 2024-06-30 HISTORY — DX: Type 2 diabetes mellitus without complications: E11.9

## 2024-06-30 MED ORDER — FENTANYL CITRATE PF 50 MCG/ML IJ SOSY
50.0000 ug | PREFILLED_SYRINGE | Freq: Once | INTRAMUSCULAR | Status: AC
  Administered 2024-06-30: 50 ug via INTRAVENOUS
  Filled 2024-06-30: qty 1

## 2024-06-30 MED ORDER — PROPOFOL 10 MG/ML IV BOLUS
0.5000 mg/kg | Freq: Once | INTRAVENOUS | Status: AC
  Administered 2024-06-30: 38.4 mg via INTRAVENOUS
  Filled 2024-06-30: qty 20

## 2024-06-30 MED ORDER — HYDROCODONE-ACETAMINOPHEN 5-325 MG PO TABS: 1.0000 | ORAL_TABLET | Freq: Four times a day (QID) | ORAL | 0 refills | Status: AC | PRN

## 2024-06-30 NOTE — ED Notes (Signed)
 Pt is awake and talking to caregiver/ left elbow is splinted and placed in arm sling

## 2024-06-30 NOTE — ED Notes (Signed)
 AVS with prescriptions provided to and discussed with patient and caregiver at bedside. Pt verbalizes understanding of discharge instructions and denies any questions or concerns at this time. Pt has ride home. Pt ambulated out of department independently with steady gait.

## 2024-06-30 NOTE — ED Triage Notes (Signed)
 BIB EMS/ from group home/ pt slipped and fell on water/ injured left elbow/ obvious deformity/ pt poor historian/ pt has guardian/ denies hitting head/ pt is A&O @ baseline

## 2024-06-30 NOTE — ED Provider Notes (Signed)
 [place] EMERGENCY DEPARTMENT AT [facility] Provider Note   CSN: [id number] Arrival date & time: 06/30/24  1115     Patient presents with: Elbow Injury   Jeremy Welch is a 63 y.o. male.  He is brought in by EMS after a slip and fall at his facility.  He is in a group home and is on a limited historian.  Reportedly slipped on some water and fell hitting his left elbow on the ground.  He is right-hand dominant.  No loss of consciousness.  Denies other injuries.  Obvious deformity per EMS and they placed in a sling.   The history is provided by the patient and the EMS personnel.  Arm Injury Location:  Elbow Elbow location:  L elbow Injury: yes   Time since incident:  1 hour Mechanism of injury: fall   Fall:    Fall occurred:  Standing and walking Pain details:    Quality:  Unable to specify   Severity:  Severe   Onset quality:  Sudden   Timing:  Constant   Progression:  Unchanged Handedness:  Right-handed Ineffective treatments:  Rest Associated symptoms: decreased range of motion   Associated symptoms: no back pain and no neck pain        Prior to Admission medications   Not on File    Allergies: Patient has no allergy information on record.    Review of Systems  Musculoskeletal:  Negative for back pain and neck pain.    Updated Vital Signs BP 132/79   Pulse 81   Temp 98.1 F (36.7 C) (Oral)   Resp 10   Wt 76.7 kg   SpO2 95%   Physical Exam Vitals and nursing note reviewed.  Constitutional:      Appearance: Normal appearance. He is well-developed.  HENT:     Head: Normocephalic and atraumatic.  Eyes:     Conjunctiva/sclera: Conjunctivae normal.  Cardiovascular:     Rate and Rhythm: Normal rate and regular rhythm.     Heart sounds: No murmur heard. Pulmonary:     Effort: Pulmonary effort is normal. No respiratory distress.     Breath sounds: Normal breath sounds.  Abdominal:     Palpations: Abdomen is soft.     Tenderness: There is  no abdominal tenderness.  Musculoskeletal:        General: Tenderness, deformity and signs of injury present.     Cervical back: Neck supple.     Comments: Left elbow with palpable deformity.  Shoulder and wrist nontender.  Able to wiggle his fingers and has strong radial pulse.  Right upper extremity and bilateral lower extremities full range of motion without any pain or limitations.  Skin:    General: Skin is warm and dry.  Neurological:     General: No focal deficit present.     Mental Status: He is alert.     GCS: GCS eye subscore is 4. GCS verbal subscore is 5. GCS motor subscore is 6.     Sensory: No sensory deficit.     Motor: No weakness.     (all labs ordered are listed, but only abnormal results are displayed) Labs Reviewed - No data to display  EKG: None  Radiology: CT Elbow Left Wo Contrast Result Date: 06/30/2024 CLINICAL DATA:  Status post elbow dislocation EXAM: CT OF THE UPPER LEFT EXTREMITY WITHOUT CONTRAST TECHNIQUE: Multidetector CT imaging of the upper left extremity was performed according to the standard protocol. RADIATION DOSE REDUCTION: This  exam was performed according to the departmental dose-optimization program which includes automated exposure control, adjustment of the mA and/or kV according to patient size and/or use of iterative reconstruction technique. COMPARISON:  Radiographs 06/30/2024 FINDINGS: Bones/Joint/Cartilage The elbow is casted in about 90 degrees of flexion accordingly nonstandard imaging planes and axes are required by necessity. Currently satisfactory alignment at the elbow joint. There are multiple tiny punctate and linear fragments in the joint especially anteriorly, with likely donor sites primarily including the coronoid process of the ulna. Do not see a well-defined radial head fracture or olecranon fracture. Two well corticated ossicles are present medial to the distal humeral medial condyle/epicondyle, as shown on image 46 series 9 and  presumably along the common flexor tendon. Well corticated and also some linear tiny calcifications along the common extensor tendon and potentially the radial collateral ligament, likely avulsions from ligamentous injury. Ligaments Suboptimally assessed by CT. Muscles and Tendons Edema along the superficial fascia margin of the common flexor tendon. Soft tissues Subcutaneous edema posterior all medial to the distal humerus and posterior to the medial triceps. This edema tracks into the posteromedial forearm. IMPRESSION: 1. The elbow is cast in about 90 degrees of flexion accordingly nonstandard imaging planes and axes are required by necessity. Currently satisfactory alignment at the elbow joint. 2. Multiple tiny punctate and linear calcific fragments in the joint especially anteriorly, with likely donor sites primarily including the coronoid process of the ulna. 3. Two well corticated ossicles medial to the distal humeral medial condyle/epicondyle, presumably along the common flexor tendon. 4. Well corticated and also some linear tiny calcifications along the common extensor tendon and potentially the radial collateral ligament, likely avulsions from ligamentous injury. 5. Subcutaneous edema posterior all medial to the distal humerus and posterior to the medial triceps. This edema tracks into the posteromedial forearm. Electronically Signed   By: Ryan Salvage M.D.   On: 06/30/2024 15:26   DG Elbow 2 Views Right Result Date: 06/30/2024 CLINICAL DATA:  Status post reduction of elbow dislocation EXAM: RIGHT ELBOW - 2 VIEW COMPARISON:  Same day. FINDINGS: Single lateral projection only was obtained. Right elbow has been splinted and immobilized. There is been successful reduction of previously described dislocation involving proximal right radius and ulna. Mild angulation of distal humerus is noted which may be due to positioning, but fracture cannot be excluded. IMPRESSION: Successful reduction of previously  described dislocation of proximal right radius and ulna. Mild angulation of distal right humerus is noted which may be due to positioning, but fracture cannot be excluded. CT scan may be performed for further evaluation. Electronically Signed   By: Lynwood Landy Raddle M.D.   On: 06/30/2024 13:49     .Sedation  Date/Time: 06/30/2024 1:10 PM  Performed by: Towana Ozell BROCKS, MD Authorized by: Towana Ozell BROCKS, MD   Consent:    Consent obtained:  Verbal and written   Consent given by:  Patient and guardian   Risks discussed:  Allergic reaction, dysrhythmia, inadequate sedation, nausea, prolonged hypoxia resulting in organ damage, prolonged sedation necessitating reversal, respiratory compromise necessitating ventilatory assistance and intubation and vomiting   Alternatives discussed:  Analgesia without sedation, anxiolysis and regional anesthesia Universal protocol:    Procedure explained and questions answered to patient or proxy's satisfaction: yes     Relevant documents present and verified: yes     Test results available: yes     Imaging studies available: yes     Required blood products, implants, devices, and special equipment  available: yes     Site/side marked: yes     Immediately prior to procedure, a time out was called: yes     Patient identity confirmed:  Verbally with patient Indications:    Procedure performed:  Dislocation reduction   Procedure necessitating sedation performed by:  Physician performing sedation Pre-sedation assessment:    Time since last food or drink:  >4   ASA classification: class 2 - patient with mild systemic disease     Mouth opening:  3 or more finger widths   Thyromental distance:  4 finger widths   Mallampati score:  I - soft palate, uvula, fauces, pillars visible   Neck mobility: normal     Pre-sedation assessments completed and reviewed: airway patency, cardiovascular function, hydration status, mental status, nausea/vomiting, pain level,  respiratory function and temperature   A pre-sedation assessment was completed prior to the start of the procedure Immediate pre-procedure details:    Reassessment: Patient reassessed immediately prior to procedure     Reviewed: vital signs, relevant labs/tests and NPO status     Verified: bag valve mask available, emergency equipment available, intubation equipment available, IV patency confirmed, oxygen available and suction available   Procedure details (see MAR for exact dosages):    Preoxygenation:  Nasal cannula   Sedation:  Propofol    Intended level of sedation: deep   Intra-procedure monitoring:  Blood pressure monitoring, cardiac monitor, continuous pulse oximetry, frequent LOC assessments, frequent vital sign checks and continuous capnometry   Intra-procedure events: none     Total Provider sedation time (minutes):  15 Post-procedure details:   A post-sedation assessment was completed following the completion of the procedure.   Attendance: Constant attendance by certified staff until patient recovered     Recovery: Patient returned to pre-procedure baseline     Post-sedation assessments completed and reviewed: airway patency, cardiovascular function, hydration status, mental status, nausea/vomiting, pain level, respiratory function and temperature     Patient is stable for discharge or admission: yes     Procedure completion:  Tolerated well, no immediate complications .Ortho Injury Treatment  Date/Time: 06/30/2024 1:11 PM  Performed by: Towana Ozell BROCKS, MD Authorized by: Towana Ozell BROCKS, MD   Consent:    Consent obtained:  Written   Consent given by:  Guardian and patient   Risks discussed:  Fracture, irreducible dislocation, recurrent dislocation, nerve damage, restricted joint movement, stiffness and vascular damage   Alternatives discussed:  Referral, immobilization and alternative treatmentInjury location: elbow Location details: left elbow Injury type:  dislocation Dislocation type: posterior Pre-procedure neurovascular assessment: neurovascularly intact Pre-procedure distal perfusion: normal Pre-procedure neurological function: normal Pre-procedure range of motion: reduced  Patient sedated: Yes. Refer to sedation procedure documentation for details of sedation. Manipulation performed: yes Reduction method: direct traction Reduction successful: yes X-ray confirmed reduction: yes Immobilization: sling and splint Splint type: long arm Splint Applied by: ED Provider and ED Tech Supplies used: Ortho-Glass Post-procedure neurovascular assessment: post-procedure neurovascularly intact Post-procedure distal perfusion: normal Post-procedure neurological function: normal Post-procedure range of motion: improved      Medications Ordered in the ED  fentaNYL  (SUBLIMAZE ) injection 50 mcg (50 mcg Intravenous Given 06/30/24 1139)  propofol  (DIPRIVAN ) 10 mg/mL bolus/IV push 38.4 mg (38.4 mg Intravenous Given 06/30/24 1238)    Clinical Course as of 06/30/24 1310  Fri Jun 30, 2024  1201 X-ray interpreted by me as positive posterior dislocation.  Awaiting radiology reading. [MB]  1309 After procedural sedation patient's elbow was manipulated and appeared to be in  better position.  Repeat x-rays ordered.  Patient tolerated procedure well. [MB]    Clinical Course User Index [MB] Towana Ozell BROCKS, MD                                 Medical Decision Making Amount and/or Complexity of Data Reviewed Radiology: ordered.  Risk Prescription drug management.   This patient complains of fall left elbow pain; this involves an extensive number of treatment Options and is a complaint that carries with it a high risk of complications and morbidity. The differential includes contusion, fracture, dislocation  I ordered medication pain medicine and medicine for sedation and reviewed PMP when indicated. I ordered imaging studies which included left  elbow x-ray and CT left elbow and I independently    visualized and interpreted imaging which showed acutely dislocated elbow Additional history obtained from group home staff member Previous records obtained and reviewed in epic no recent admissions Cardiac monitoring reviewed, normal sinus rhythm Social determinants considered, no significant barriers Critical Interventions: None  After the interventions stated above, I reevaluated the patient and found patient's pain to be controlled and distal neurovascular intact Admission and further testing considered, he is appropriate for discharge to follow-up outpatient with orthopedics.  Contact information given along with prescription for pain medication as needed.  Return instructions discussed.      Final diagnoses:  Anterior dislocation of left elbow, initial encounter    ED Discharge Orders          Ordered    HYDROcodone -acetaminophen  (NORCO/VICODIN) 5-325 MG tablet  Every 6 hours PRN        06/30/24 1558               Towana Ozell BROCKS, MD 06/30/24 1759

## 2024-06-30 NOTE — Discharge Instructions (Addendum)
 You are seen in the emergency department for left elbow pain after a fall.  Your elbow was dislocated and you were given medication and it was reduced.  Please keep the splint on clean and dry.  Tylenol  for pain.  Hydrocodone  for severe pain.  Follow-up with orthopedics next week.  Return if any worsening or concerning symptoms

## 2024-06-30 NOTE — ED Notes (Addendum)
 40mg  Propofol  given in addition to initial dose - per Dr. Towana

## 2024-07-03 DIAGNOSIS — R030 Elevated blood-pressure reading, without diagnosis of hypertension: Secondary | ICD-10-CM | POA: Diagnosis not present

## 2024-07-03 DIAGNOSIS — S53146D Lateral dislocation of unspecified ulnohumeral joint, subsequent encounter: Secondary | ICD-10-CM | POA: Diagnosis not present

## 2024-07-13 ENCOUNTER — Ambulatory Visit: Admitting: Orthopedic Surgery

## 2024-07-13 ENCOUNTER — Encounter: Payer: Self-pay | Admitting: Orthopedic Surgery

## 2024-07-13 VITALS — BP 140/78 | HR 77 | Ht 68.0 in | Wt 163.0 lb

## 2024-07-13 DIAGNOSIS — S53105A Unspecified dislocation of left ulnohumeral joint, initial encounter: Secondary | ICD-10-CM

## 2024-07-13 NOTE — Progress Notes (Signed)
 Chief Complaint  Patient presents with   Elbow Injury    L elbow DOI 06/30/24   63 year old male lives at a group home fell injured his left elbow and sustained a dislocation.  He had closed reduction under sedation in the ER with a postreduction CT and x-ray confirming reduction  Comes in today with no complaints other than mild to moderate discomfort over the left elbow denies any numbness and tingling of the hand  Problem list, medical hx, medications and allergies reviewed    BP (!) 140/78   Pulse 77   Ht 5' 8 (1.727 m)   Wt 163 lb (73.9 kg)   BMI 24.78 kg/m   Physical Exam Vitals and nursing note reviewed. Exam conducted with a chaperone present.  Constitutional:      General: He is not in acute distress.    Appearance: Normal appearance. He is normal weight. He is not ill-appearing, toxic-appearing or diaphoretic.  HENT:     Head: Normocephalic and atraumatic.  Eyes:     General: No scleral icterus.       Right eye: No discharge.        Left eye: No discharge.     Extraocular Movements: Extraocular movements intact.     Pupils: Pupils are equal, round, and reactive to light.  Cardiovascular:     Rate and Rhythm: Normal rate.     Pulses: Normal pulses.  Pulmonary:     Effort: Pulmonary effort is normal.  Musculoskeletal:     Comments: Left elbow  The skin has some bruising and ecchymosis there is swelling of the hand from the tight splint  However neurovascular exam is intact  Range of motion 10-125  Skin:    General: Skin is warm and dry.     Capillary Refill: Capillary refill takes less than 2 seconds.  Neurological:     General: No focal deficit present.     Mental Status: He is alert and oriented to person, place, and time.     Sensory: No sensory deficit.     Gait: Gait normal.  Psychiatric:        Mood and Affect: Mood normal.    Encounter Diagnosis  Name Primary?   Dislocation of left elbow, initial encounter Yes    Sling returned patient will  stay on that for 4 weeks can have active range of motion PT 10-125 degrees

## 2024-07-31 DIAGNOSIS — I10 Essential (primary) hypertension: Secondary | ICD-10-CM | POA: Diagnosis not present

## 2024-07-31 DIAGNOSIS — E7849 Other hyperlipidemia: Secondary | ICD-10-CM | POA: Diagnosis not present

## 2024-07-31 DIAGNOSIS — E1165 Type 2 diabetes mellitus with hyperglycemia: Secondary | ICD-10-CM | POA: Diagnosis not present

## 2024-07-31 DIAGNOSIS — E559 Vitamin D deficiency, unspecified: Secondary | ICD-10-CM | POA: Diagnosis not present

## 2024-08-03 DIAGNOSIS — H2513 Age-related nuclear cataract, bilateral: Secondary | ICD-10-CM | POA: Diagnosis not present

## 2024-08-07 DIAGNOSIS — K2100 Gastro-esophageal reflux disease with esophagitis, without bleeding: Secondary | ICD-10-CM | POA: Diagnosis not present

## 2024-08-07 DIAGNOSIS — Z0001 Encounter for general adult medical examination with abnormal findings: Secondary | ICD-10-CM | POA: Diagnosis not present

## 2024-08-07 DIAGNOSIS — Z1389 Encounter for screening for other disorder: Secondary | ICD-10-CM | POA: Diagnosis not present

## 2024-08-07 DIAGNOSIS — Z23 Encounter for immunization: Secondary | ICD-10-CM | POA: Diagnosis not present

## 2024-08-07 DIAGNOSIS — I10 Essential (primary) hypertension: Secondary | ICD-10-CM | POA: Diagnosis not present

## 2024-08-07 DIAGNOSIS — E1165 Type 2 diabetes mellitus with hyperglycemia: Secondary | ICD-10-CM | POA: Diagnosis not present

## 2024-08-07 DIAGNOSIS — E7849 Other hyperlipidemia: Secondary | ICD-10-CM | POA: Diagnosis not present

## 2024-08-09 DIAGNOSIS — S53105A Unspecified dislocation of left ulnohumeral joint, initial encounter: Secondary | ICD-10-CM | POA: Insufficient documentation

## 2024-08-14 ENCOUNTER — Ambulatory Visit (INDEPENDENT_AMBULATORY_CARE_PROVIDER_SITE_OTHER): Admitting: Orthopedic Surgery

## 2024-08-14 DIAGNOSIS — S53105D Unspecified dislocation of left ulnohumeral joint, subsequent encounter: Secondary | ICD-10-CM

## 2024-08-14 NOTE — Progress Notes (Signed)
   There were no vitals taken for this visit.  There is no height or weight on file to calculate BMI.  Chief Complaint  Patient presents with   Elbow Injury    Left- DOI 06/30/24     Encounter Diagnosis  Name Primary?   Dislocation of left elbow, subsequent encounter 06/30/24 Yes    DOI/DOS/ Date: 06/30/24  Improved-

## 2024-08-14 NOTE — Progress Notes (Signed)
   Chief Complaint  Patient presents with   Elbow Injury    Left- DOI 06/30/24     Encounter Diagnosis  Name Primary?   Dislocation of left elbow, subsequent encounter 06/30/24 Yes    DOI/DOS/ Date: 06/30/24  Improved-  No complaints left elbow  Left elbow full range of motion left elbow stable in extension and flexion  Released

## 2024-09-05 DIAGNOSIS — M79671 Pain in right foot: Secondary | ICD-10-CM | POA: Diagnosis not present

## 2024-09-05 DIAGNOSIS — E1140 Type 2 diabetes mellitus with diabetic neuropathy, unspecified: Secondary | ICD-10-CM | POA: Diagnosis not present

## 2024-09-05 DIAGNOSIS — M79674 Pain in right toe(s): Secondary | ICD-10-CM | POA: Diagnosis not present

## 2024-09-05 DIAGNOSIS — I739 Peripheral vascular disease, unspecified: Secondary | ICD-10-CM | POA: Diagnosis not present

## 2024-09-05 DIAGNOSIS — M79672 Pain in left foot: Secondary | ICD-10-CM | POA: Diagnosis not present

## 2024-09-05 DIAGNOSIS — M79675 Pain in left toe(s): Secondary | ICD-10-CM | POA: Diagnosis not present

## 2024-09-05 DIAGNOSIS — L110 Acquired keratosis follicularis: Secondary | ICD-10-CM | POA: Diagnosis not present

## 2024-09-19 DIAGNOSIS — Z23 Encounter for immunization: Secondary | ICD-10-CM | POA: Diagnosis not present

## 2024-09-26 NOTE — Progress Notes (Signed)
 Jeremy Welch                                          MRN: [id number]   09/26/2024   The [facility] Quality Team Specialist reviewed this patient medical record for the purposes of chart review for care gap closure. The following were reviewed: chart review for care gap closure-kidney health evaluation for diabetes:uACR.    [facility] Quality Team
